# Patient Record
Sex: Male | Born: 1965 | Race: White | Hispanic: No | Marital: Married | Smoking: Never smoker
Health system: Southern US, Community
[De-identification: ages and names within clinical notes are randomized; demographics above are authoritative.]

## PROBLEM LIST (undated history)

## (undated) DIAGNOSIS — N200 Calculus of kidney: Secondary | ICD-10-CM

## (undated) HISTORY — PX: ABDOMINAL SURGERY: SHX537

## (undated) HISTORY — PX: KNEE SURGERY: SHX244

---

## 2005-10-03 ENCOUNTER — Encounter: Admission: RE | Admit: 2005-10-03 | Discharge: 2005-10-03 | Payer: Self-pay | Admitting: Orthopedic Surgery

## 2005-10-07 ENCOUNTER — Ambulatory Visit (HOSPITAL_BASED_OUTPATIENT_CLINIC_OR_DEPARTMENT_OTHER): Admission: RE | Admit: 2005-10-07 | Discharge: 2005-10-08 | Payer: Self-pay | Admitting: Orthopedic Surgery

## 2010-06-02 ENCOUNTER — Emergency Department (HOSPITAL_COMMUNITY)
Admission: EM | Admit: 2010-06-02 | Discharge: 2010-06-02 | Disposition: A | Discharge status: Home / Self Care | Payer: Self-pay | Attending: Emergency Medicine | Admitting: Emergency Medicine

## 2010-06-02 ENCOUNTER — Emergency Department (HOSPITAL_COMMUNITY): Payer: Self-pay

## 2010-06-02 DIAGNOSIS — K612 Anorectal abscess: Secondary | ICD-10-CM | POA: Insufficient documentation

## 2010-06-02 DIAGNOSIS — R1909 Other intra-abdominal and pelvic swelling, mass and lump: Secondary | ICD-10-CM | POA: Insufficient documentation

## 2010-06-02 DIAGNOSIS — K6289 Other specified diseases of anus and rectum: Secondary | ICD-10-CM | POA: Insufficient documentation

## 2010-06-02 DIAGNOSIS — K644 Residual hemorrhoidal skin tags: Secondary | ICD-10-CM | POA: Insufficient documentation

## 2010-06-02 LAB — DIFFERENTIAL
Basophils Absolute: 0.1 K/uL (ref 0.0–0.1)
Basophils Relative: 0 % (ref 0–1)
Eosinophils Absolute: 0.1 10*3/uL (ref 0.0–0.7)
Eosinophils Relative: 1 % (ref 0–5)
Lymphocytes Relative: 22 % (ref 12–46)
Lymphs Abs: 2.5 10*3/uL (ref 0.7–4.0)
Monocytes Absolute: 0.9 10*3/uL (ref 0.1–1.0)
Monocytes Relative: 8 % (ref 3–12)
Neutro Abs: 8 K/uL — ABNORMAL HIGH (ref 1.7–7.7)
Neutrophils Relative %: 69 % (ref 43–77)

## 2010-06-02 LAB — CBC
HCT: 45.6 % (ref 39.0–52.0)
Hemoglobin: 15.8 g/dL (ref 13.0–17.0)
MCH: 31.7 pg (ref 26.0–34.0)
MCHC: 34.6 g/dL (ref 30.0–36.0)
MCV: 91.6 fL (ref 78.0–100.0)
Platelets: 218 10*3/uL (ref 150–400)
RBC: 4.98 MIL/uL (ref 4.22–5.81)
RDW: 12.5 % (ref 11.5–15.5)
WBC: 11.5 10*3/uL — ABNORMAL HIGH (ref 4.0–10.5)

## 2010-06-02 LAB — BASIC METABOLIC PANEL WITH GFR
CO2: 29 meq/L (ref 19–32)
Calcium: 9.5 mg/dL (ref 8.4–10.5)
GFR calc Af Amer: >60 mL/min (ref >60)
Potassium: 4.1 meq/L (ref 3.5–5.1)
Sodium: 141 meq/L (ref 135–145)

## 2010-06-02 LAB — BASIC METABOLIC PANEL
BUN: 19 mg/dL (ref 6–23)
Chloride: 102 mEq/L (ref 96–112)
Creatinine, Ser: 1.15 mg/dL (ref 0.4–1.5)
GFR calc non Af Amer: >60 mL/min (ref >60)
Glucose, Bld: 105 mg/dL — ABNORMAL HIGH (ref 70–99)

## 2010-06-02 MED ORDER — IOHEXOL 300 MG/ML  SOLN: 100.0000 mL | Freq: Once | INTRAMUSCULAR | Status: DC | PRN

## 2010-06-07 ENCOUNTER — Ambulatory Visit: Payer: Self-pay | Admitting: Urgent Care

## 2010-06-14 ENCOUNTER — Encounter: Payer: Self-pay | Admitting: Urgent Care

## 2010-06-14 ENCOUNTER — Encounter: Payer: Self-pay | Admitting: Internal Medicine

## 2010-06-14 ENCOUNTER — Ambulatory Visit (INDEPENDENT_AMBULATORY_CARE_PROVIDER_SITE_OTHER): Payer: Self-pay | Admitting: Urgent Care

## 2010-06-14 DIAGNOSIS — K612 Anorectal abscess: Secondary | ICD-10-CM | POA: Insufficient documentation

## 2010-06-14 DIAGNOSIS — R198 Other specified symptoms and signs involving the digestive system and abdomen: Secondary | ICD-10-CM | POA: Insufficient documentation

## 2010-06-18 ENCOUNTER — Ambulatory Visit (HOSPITAL_COMMUNITY)
Admission: RE | Admit: 2010-06-18 | Discharge: 2010-06-18 | Disposition: A | Discharge status: Home / Self Care | Payer: Self-pay | Source: Ambulatory Visit | Attending: Internal Medicine | Admitting: Internal Medicine

## 2010-06-18 ENCOUNTER — Other Ambulatory Visit: Payer: Self-pay | Admitting: Internal Medicine

## 2010-06-18 ENCOUNTER — Encounter: Payer: Self-pay | Admitting: Internal Medicine

## 2010-06-18 DIAGNOSIS — K5730 Diverticulosis of large intestine without perforation or abscess without bleeding: Secondary | ICD-10-CM | POA: Insufficient documentation

## 2010-06-18 DIAGNOSIS — D128 Benign neoplasm of rectum: Secondary | ICD-10-CM

## 2010-06-18 DIAGNOSIS — D129 Benign neoplasm of anus and anal canal: Secondary | ICD-10-CM | POA: Insufficient documentation

## 2010-06-18 DIAGNOSIS — R933 Abnormal findings on diagnostic imaging of other parts of digestive tract: Secondary | ICD-10-CM | POA: Insufficient documentation

## 2010-06-18 DIAGNOSIS — D126 Benign neoplasm of colon, unspecified: Secondary | ICD-10-CM | POA: Insufficient documentation

## 2010-06-18 DIAGNOSIS — Z800 Family history of malignant neoplasm of digestive organs: Secondary | ICD-10-CM | POA: Insufficient documentation

## 2010-06-22 NOTE — Letter (Signed)
Summary: TCS ORDER  TCS ORDER   Imported By: Ave Filter 06/14/2010 11:28:11  _____________________________________________________________________  External Attachment:    Type:   Image     Comment:   External Document
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
## 2010-06-29 ENCOUNTER — Encounter: Payer: Self-pay | Admitting: Internal Medicine

## 2010-06-30 NOTE — Op Note (Addendum)
NAME:  Grant [name], Grant [name]               ACCOUNT NO.:  [id number]  MEDICAL RECORD NO.:  [id number]           PATIENT TYPE:  O  LOCATION:  [facility]                          FACILITY:  [facility]  PHYSICIAN:  R. Roetta Sessions, M.D. DATE OF BIRTH:  20-Aug-1965  DATE OF PROCEDURE:  06/18/2010 DATE OF DISCHARGE:                              OPERATIVE REPORT   PROCEDURE:  Colonoscopy with snare polypectomy.  INDICATIONS FOR PROCEDURE:  A 45 year old gentleman with recent perirectal abscess I and D'ed in emergency department, has had problems with significant proctalgia.  CT back earlier this month demonstrated questionable small circumferential mass in the upper rectum at most 1 cm in dimensions.  Mr. Viana currently has finished up treatment for his perirectal abscess.  He tells me his proctalgia has resolved.  He is not having any rectal bleeding.  Bowel function is normalized.  Positive family history of colon cancer in multiple second-degree relatives. Given CT findings and family history, we will go ahead and offer him a colonoscopy.  Risks, benefits, alternatives, limitations, imponderables have been discussed, questions answered.  Please see the documentation in the medical record.  PROCEDURE NOTE:  O2 saturation, blood pressure, pulse, respirations were monitored throughout the entire procedure.  CONSCIOUS SEDATION:  Versed 7 mg IV, Demerol 125 mg IV in divided doses, Phenergan 25 mg dilute slow IV push to augment conscious sedation.  INSTRUMENT:  Pentax video chip system.  FINDINGS:  Digital rectal exam revealed no abnormalities.  Endoscopic findings:  Prep was adequate.  Colon:  Colonic mucosa was surveyed from the rectosigmoid junction through the left transverse right colon to the appendiceal orifice, ileocecal valve/cecum.  These structures were well seen and photographed for the record.  From this level, scope was slowly and cautiously withdrawn.  All previously mentioned mucosal  surfaces were again seen.  The patient has single ascending colon diverticulum at 30 cm corresponding midsigmoid.  The patient had a 1-cm angry pedunculated polyp which was hot snared and removed totally in one pass and recovered through with the scope.  Remainder of colonic mucosa appeared normal.  Scope was pulled down to the rectum where a thorough examination of rectal mucosa including retroflexed view of the anal verge demonstrated a 6-mm polyp and at 5 cm of the anal verge, it was hot snared and removed as well.  Remainder of rectal mucosa appeared normal.  The patient tolerated the procedure well.  Cecal withdrawal time 11 minutes.  IMPRESSION:  Rectal polyp status post hot snare polypectomy.  Angry pedunculated polyp midsigmoid status post hot snare polypectomy, solitary ascending colon diverticulum, otherwise normal colon.  RECOMMENDATIONS: 1. No Advil or other NSAIDs, aspirin for next 7 days. 2. Follow up path. 3. Polyp and diverticulosis literature provided to Mr. Nordquist.     Jonathon Bellows, M.D.     RMR/MEDQ  D:  06/18/2010  T:  06/18/2010  Job:  [id number]  cc:   Lear Ng, MD Fax: [phone]  Electronically Signed by Lorrin Goodell M.D. on 06/29/2010 02:41:00 PM Electronically Signed by Lorrin Goodell M.D. on 06/29/2010 03:21:08 PM Electronically Signed by Lorrin Goodell M.D. on  06/29/2010 03:45:26 PM Electronically Signed by Lorrin Goodell M.D. on 06/29/2010 04:22:57 PM Electronically Signed by Lorrin Goodell M.D. on 06/29/2010 04:54:15 PM Electronically Signed by Lorrin Goodell M.D. on 06/29/2010 04:54:15 PM Electronically Signed by Lorrin Goodell M.D. on 06/29/2010 07:36:57 PM

## 2010-07-06 NOTE — Letter (Signed)
Summary: Patient Notice, Colon Biopsy Results  [facility]  [address]   [place], [place] [address]   Phone: [phone]  Fax: [phone]       June 29, 2010   Grant [name] [address] [place], [place]  [address] 02/07/1966    Dear Grant [name],  I am pleased to inform you that the biopsies taken during your recent colonoscopy did not show any evidence of cancer upon pathologic examination.  Additional information/recommendations:  No further action is needed at this time.  Please follow-up with your primary care physician for your other healthcare needs.  Continue with the treatment plan as outlined on the day of your exam.  You should have a repeat colonoscopy examination  in 5 years.  Please call us if you are having persistent problems or have questions about your condition that have not been fully answered at this time.  Sincerely,    R. Roetta Sessions MD, FACP [facility] Ph: [phone]    Fax: [phone]   Appended Document: Patient Notice, Colon Biopsy Results Letter mailed to pt.  Appended Document: Patient Notice, Colon Biopsy Results reminder in epic

## 2012-02-26 IMAGING — CT CT PELVIS W/ CM
2 of 4 series · 16 of 46 positions shown, 18 images · IV contrast (agent unspecified)
Comparison: None.

CLINICAL DATA: Rectal mass.  Rectal pain.

CT PELVIS WITH CONTRAST
TECHNIQUE: Multidetector CT imaging of the pelvis was performed
using the standard protocol following the bolus administration of
intravenous contrast.
Contrast:  100 ml Lmnipaque-UGG

[Series 2: pel_with 5.0 b40f · axial · 0.80mm/px · z∈[-366,-106]mm · 13 of 60 slices shown, 15 images]
[im 4/60  soft-tissue]
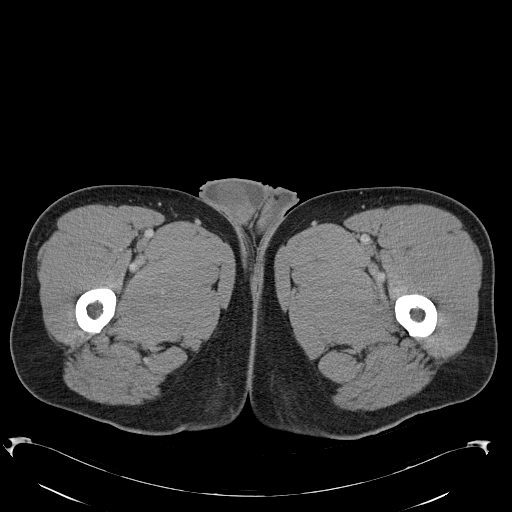
[im 4/60  bone]
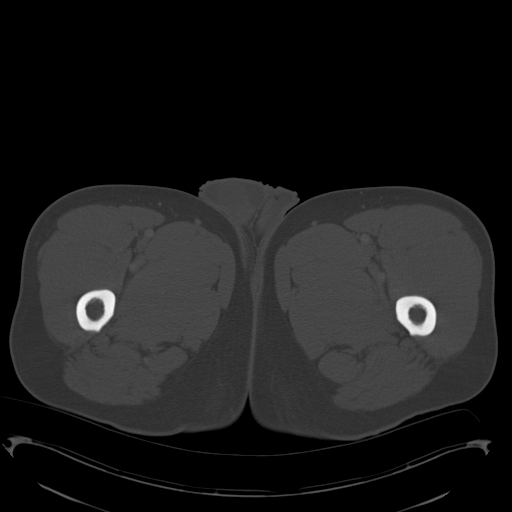
[im 8/60  soft-tissue]
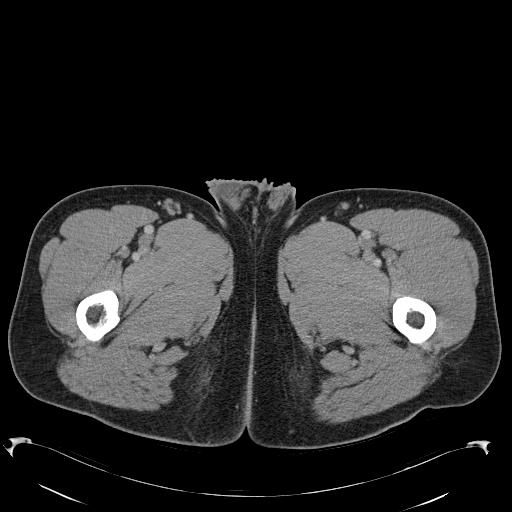
[im 12/60  soft-tissue]
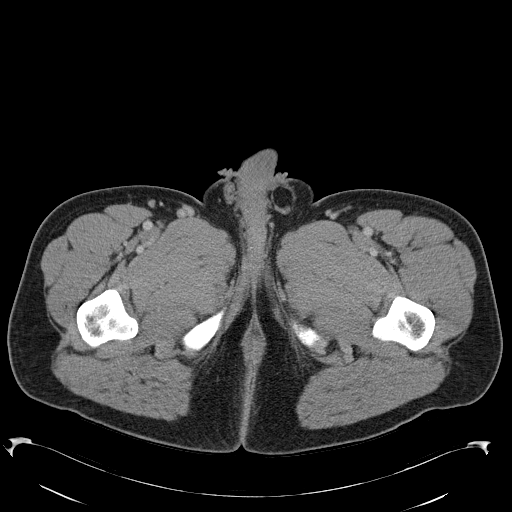
[im 16/60  soft-tissue]
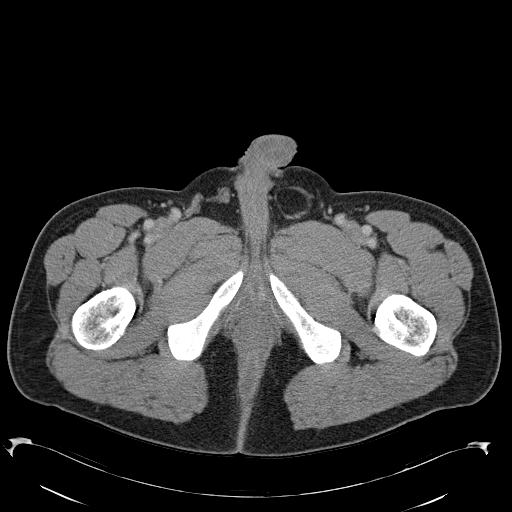
[im 20/60  soft-tissue]
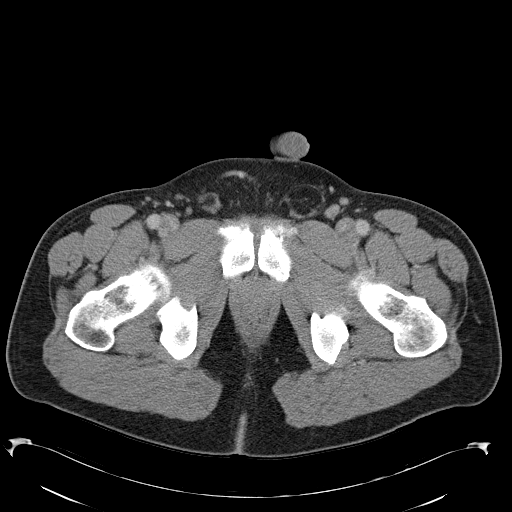
[im 24/60  soft-tissue]
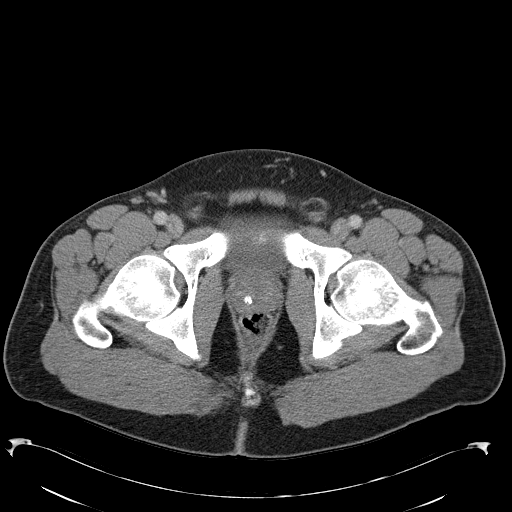
[im 32/60  soft-tissue]
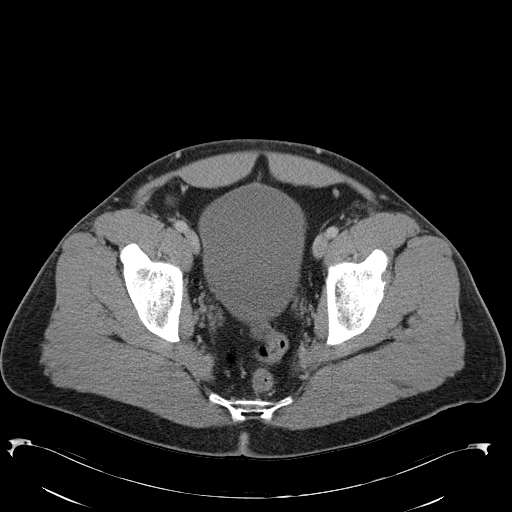
[im 36/60  soft-tissue]
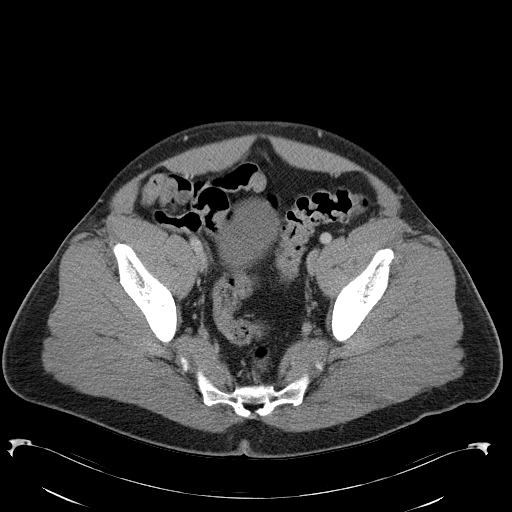
[im 40/60  soft-tissue]
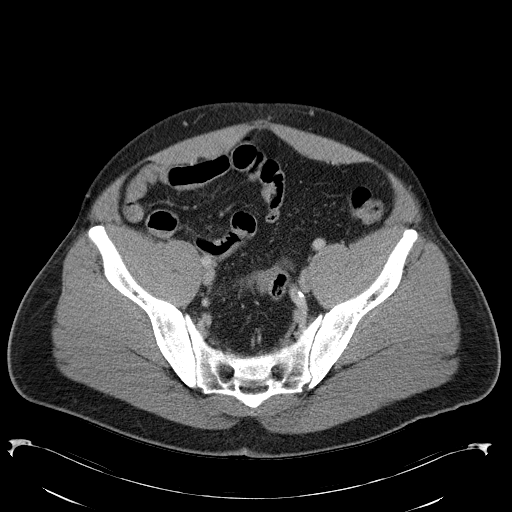
[im 40/60  bone]
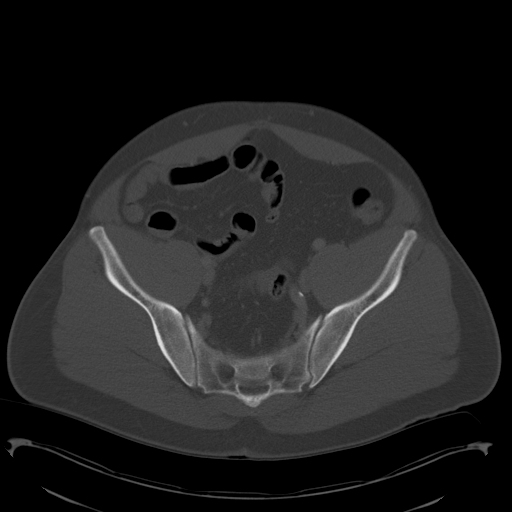
[im 44/60  soft-tissue]
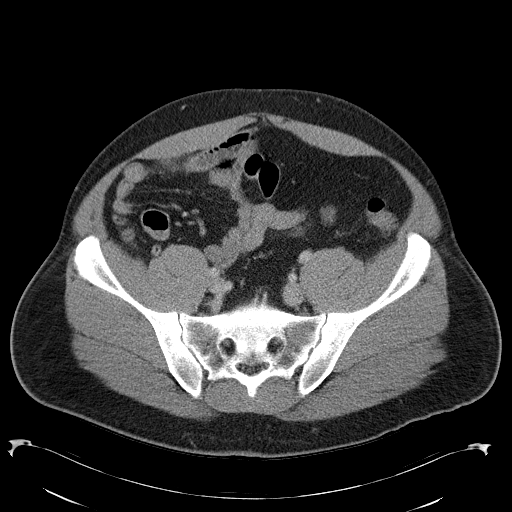
[im 48/60  soft-tissue]
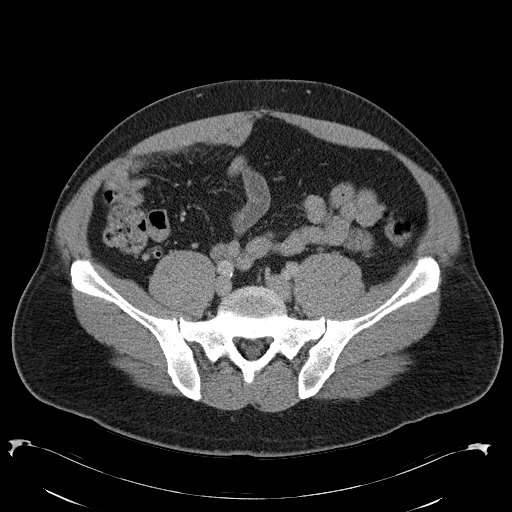
[im 52/60  soft-tissue]
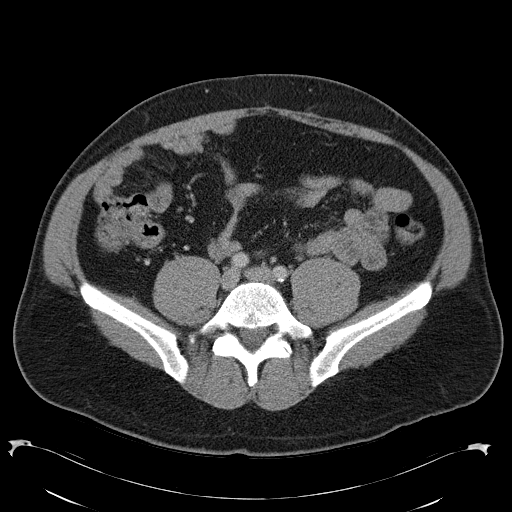
[im 56/60  soft-tissue]
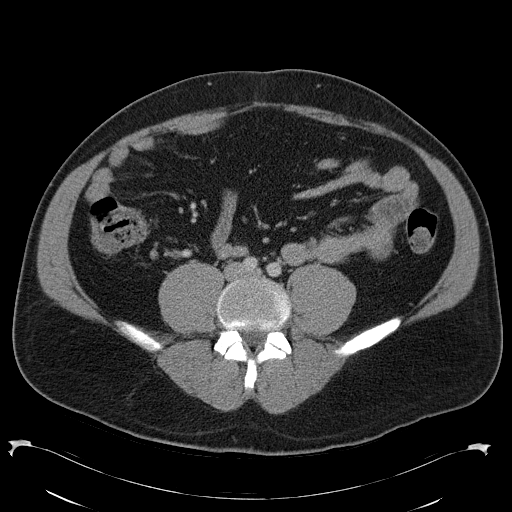

[Series 3: pel_with 3.0 spo cor cor · coronal · 0.59mm/px · 3 of 97 slices shown]
[im 33/97  soft-tissue]
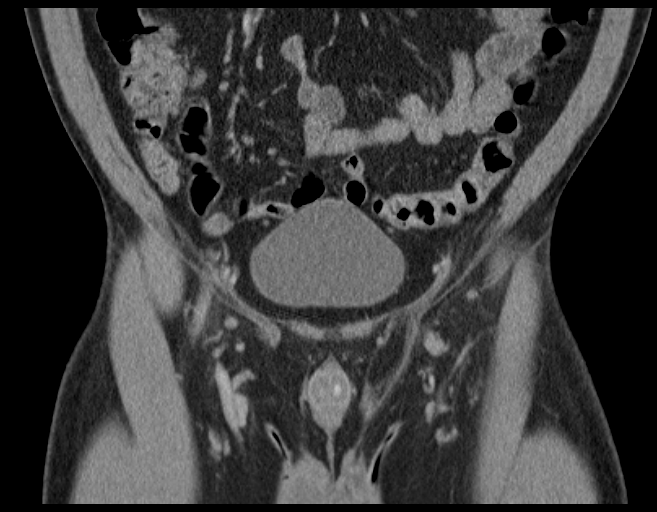
[im 43/97  soft-tissue]
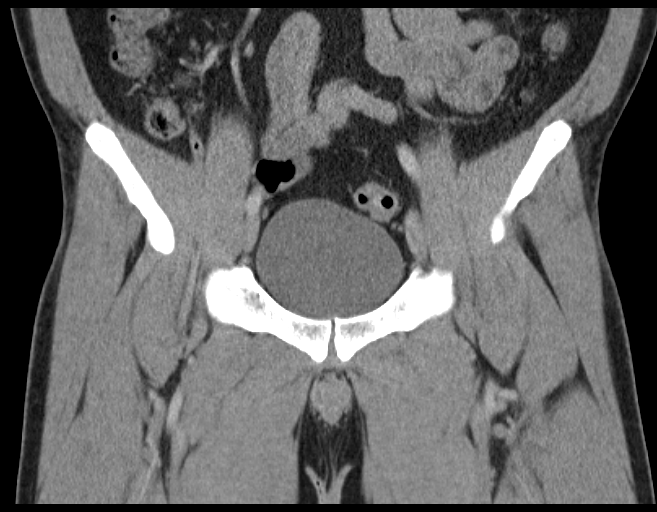
[im 54/97  soft-tissue]
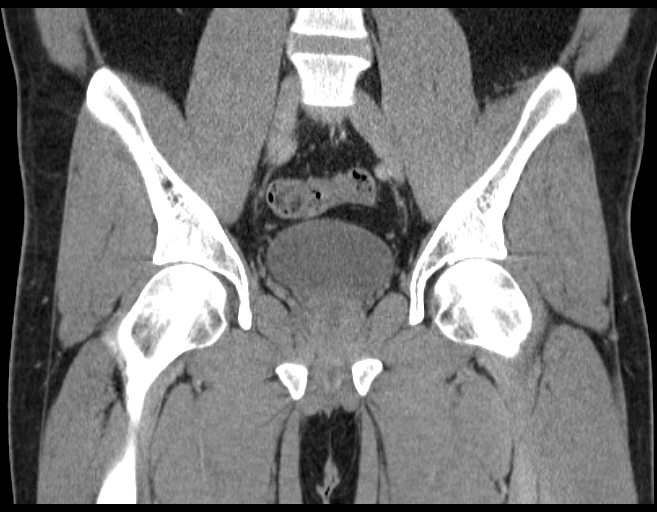

[16 of 46 positions shown; findings below may reference images not displayed]

FINDINGS: There is an area of circumferential narrowing of the
upper rectum approximately 5 cm above the anus which could
represent a colon tumor.  The abnormality is only 1 cm in width.
This is not definitive  but correlates with the history of a
palpable rectal abnormality.  There is no obstruction.

There is no adenopathy.

The terminal ileum and appendix are normal.  The osseous structures
are normal.  Distal ureters and bladder are normal.  Calcifications
are present in the prostate gland, which is not enlarged.  There is
a small amount of fat in a small left inguinal hernia.
IMPRESSION: 1.  Possible small circumferential mass in the upper rectum.  This
only measures 1 cm in width.
2.  No other significant abnormalities.

## 2013-02-02 ENCOUNTER — Observation Stay (HOSPITAL_COMMUNITY): Payer: Self-pay

## 2013-02-02 ENCOUNTER — Inpatient Hospital Stay (HOSPITAL_COMMUNITY)
Admission: EM | Admit: 2013-02-02 | Discharge: 2013-02-06 | DRG: 694 | Disposition: A | Discharge status: Home / Self Care | Payer: Self-pay | Attending: Family Medicine | Admitting: Family Medicine

## 2013-02-02 ENCOUNTER — Encounter (HOSPITAL_COMMUNITY): Payer: Self-pay | Admitting: Emergency Medicine

## 2013-02-02 ENCOUNTER — Emergency Department (HOSPITAL_COMMUNITY): Payer: Self-pay

## 2013-02-02 DIAGNOSIS — Z91041 Radiographic dye allergy status: Secondary | ICD-10-CM

## 2013-02-02 DIAGNOSIS — R198 Other specified symptoms and signs involving the digestive system and abdomen: Secondary | ICD-10-CM

## 2013-02-02 DIAGNOSIS — N132 Hydronephrosis with renal and ureteral calculous obstruction: Secondary | ICD-10-CM

## 2013-02-02 DIAGNOSIS — K612 Anorectal abscess: Secondary | ICD-10-CM

## 2013-02-02 DIAGNOSIS — D72829 Elevated white blood cell count, unspecified: Secondary | ICD-10-CM | POA: Diagnosis present

## 2013-02-02 DIAGNOSIS — N1330 Unspecified hydronephrosis: Secondary | ICD-10-CM

## 2013-02-02 DIAGNOSIS — N201 Calculus of ureter: Principal | ICD-10-CM | POA: Diagnosis present

## 2013-02-02 DIAGNOSIS — N200 Calculus of kidney: Secondary | ICD-10-CM | POA: Diagnosis present

## 2013-02-02 DIAGNOSIS — Z87442 Personal history of urinary calculi: Secondary | ICD-10-CM

## 2013-02-02 DIAGNOSIS — N179 Acute kidney failure, unspecified: Secondary | ICD-10-CM | POA: Diagnosis present

## 2013-02-02 DIAGNOSIS — R109 Unspecified abdominal pain: Secondary | ICD-10-CM

## 2013-02-02 DIAGNOSIS — N23 Unspecified renal colic: Secondary | ICD-10-CM

## 2013-02-02 HISTORY — DX: Calculus of kidney: N20.0

## 2013-02-02 LAB — CBC
HCT: 41.7 % (ref 39.0–52.0)
Hemoglobin: 14.3 g/dL (ref 13.0–17.0)
MCH: 32 pg (ref 26.0–34.0)
MCHC: 34.3 g/dL (ref 30.0–36.0)
MCV: 93.3 fL (ref 78.0–100.0)
Platelets: 213 10*3/uL (ref 150–400)
RBC: 4.47 MIL/uL (ref 4.22–5.81)
RDW: 12.1 % (ref 11.5–15.5)
WBC: 16.8 10*3/uL — ABNORMAL HIGH (ref 4.0–10.5)

## 2013-02-02 LAB — BASIC METABOLIC PANEL
BUN: 18 mg/dL (ref 6–23)
CO2: 28 mEq/L (ref 19–32)
Calcium: 9.1 mg/dL (ref 8.4–10.5)
Chloride: 100 mEq/L (ref 96–112)
Creatinine, Ser: 1.44 mg/dL — ABNORMAL HIGH (ref 0.50–1.35)
GFR calc Af Amer: 66 mL/min — ABNORMAL LOW (ref >90)
GFR calc non Af Amer: 57 mL/min — ABNORMAL LOW (ref >90)
Glucose, Bld: 128 mg/dL — ABNORMAL HIGH (ref 70–99)
Potassium: 3.4 mEq/L — ABNORMAL LOW (ref 3.5–5.1)
Sodium: 136 mEq/L (ref 135–145)

## 2013-02-02 MED ORDER — CIPROFLOXACIN IN D5W 200 MG/100ML IV SOLN
200.0000 mg | INTRAVENOUS | Status: DC
  Administered 2013-02-02: 200 mg via INTRAVENOUS
  Filled 2013-02-02 (×2): qty 100

## 2013-02-02 MED ORDER — SODIUM CHLORIDE 0.9 % IV SOLN
INTRAVENOUS | Status: DC
  Administered 2013-02-02 – 2013-02-04 (×3): via INTRAVENOUS

## 2013-02-02 MED ORDER — OXYCODONE HCL 5 MG PO TABS
5.0000 mg | ORAL_TABLET | ORAL | Status: DC | PRN
  Administered 2013-02-02 – 2013-02-03 (×2): 5 mg via ORAL
  Filled 2013-02-02 (×2): qty 1

## 2013-02-02 MED ORDER — CIPROFLOXACIN IN D5W 200 MG/100ML IV SOLN
INTRAVENOUS | Status: AC
  Filled 2013-02-02: qty 100

## 2013-02-02 MED ORDER — HYDROMORPHONE HCL PF 2 MG/ML IJ SOLN
2.0000 mg | Freq: Once | INTRAMUSCULAR | Status: AC
  Administered 2013-02-02: 2 mg via INTRAVENOUS
  Filled 2013-02-02: qty 1

## 2013-02-02 MED ORDER — KETOROLAC TROMETHAMINE 30 MG/ML IJ SOLN: 30.0000 mg | Freq: Once | INTRAMUSCULAR | Status: DC

## 2013-02-02 MED ORDER — TAMSULOSIN HCL 0.4 MG PO CAPS
0.4000 mg | ORAL_CAPSULE | Freq: Every day | ORAL | Status: DC
  Administered 2013-02-02 – 2013-02-06 (×5): 0.4 mg via ORAL
  Filled 2013-02-02 (×5): qty 1

## 2013-02-02 MED ORDER — ONDANSETRON HCL 4 MG/2ML IJ SOLN
4.0000 mg | Freq: Four times a day (QID) | INTRAMUSCULAR | Status: DC | PRN
  Administered 2013-02-02 – 2013-02-04 (×2): 4 mg via INTRAVENOUS
  Filled 2013-02-02 (×2): qty 2

## 2013-02-02 MED ORDER — HYDROMORPHONE HCL PF 1 MG/ML IJ SOLN
1.0000 mg | INTRAMUSCULAR | Status: DC | PRN
  Administered 2013-02-02: 1 mg via INTRAVENOUS
  Filled 2013-02-02: qty 1

## 2013-02-02 MED ORDER — LORAZEPAM 2 MG/ML IJ SOLN
1.0000 mg | Freq: Once | INTRAMUSCULAR | Status: AC
  Administered 2013-02-02: 1 mg via INTRAVENOUS
  Filled 2013-02-02: qty 1

## 2013-02-02 MED ORDER — FENTANYL CITRATE 0.05 MG/ML IJ SOLN
INTRAMUSCULAR | Status: AC
  Administered 2013-02-02: 100 ug
  Filled 2013-02-02: qty 2

## 2013-02-02 MED ORDER — HEPARIN SODIUM (PORCINE) 5000 UNIT/ML IJ SOLN
5000.0000 [IU] | Freq: Three times a day (TID) | INTRAMUSCULAR | Status: DC
  Administered 2013-02-02 – 2013-02-05 (×8): 5000 [IU] via SUBCUTANEOUS
  Filled 2013-02-02 (×9): qty 1

## 2013-02-02 MED ORDER — ACETAMINOPHEN 650 MG RE SUPP: 650.0000 mg | Freq: Four times a day (QID) | RECTAL | Status: DC | PRN

## 2013-02-02 MED ORDER — HYDROMORPHONE HCL PF 1 MG/ML IJ SOLN
1.0000 mg | INTRAMUSCULAR | Status: DC | PRN
  Administered 2013-02-02 – 2013-02-03 (×7): 1 mg via INTRAVENOUS
  Filled 2013-02-02 (×6): qty 1

## 2013-02-02 MED ORDER — HYDROMORPHONE HCL PF 1 MG/ML IJ SOLN
1.0000 mg | Freq: Once | INTRAMUSCULAR | Status: AC
  Administered 2013-02-02: 1 mg via INTRAVENOUS
  Filled 2013-02-02: qty 1

## 2013-02-02 MED ORDER — PANTOPRAZOLE SODIUM 40 MG IV SOLR
40.0000 mg | INTRAVENOUS | Status: DC
  Administered 2013-02-02 – 2013-02-05 (×4): 40 mg via INTRAVENOUS
  Filled 2013-02-02 (×4): qty 40

## 2013-02-02 MED ORDER — ACETAMINOPHEN 325 MG PO TABS: 650.0000 mg | ORAL_TABLET | Freq: Four times a day (QID) | ORAL | Status: DC | PRN

## 2013-02-02 MED ORDER — SODIUM CHLORIDE 0.9 % IV BOLUS (SEPSIS)
1000.0000 mL | Freq: Once | INTRAVENOUS | Status: AC
  Administered 2013-02-02: 1000 mL via INTRAVENOUS

## 2013-02-02 MED ORDER — ACETAMINOPHEN 500 MG PO TABS
1000.0000 mg | ORAL_TABLET | Freq: Once | ORAL | Status: AC
  Administered 2013-02-02: 1000 mg via ORAL
  Filled 2013-02-02: qty 2

## 2013-02-02 NOTE — ED Notes (Signed)
Dr. Gwendolyn Grant notified of pt's pain and discomfort.  No new orders at this time.

## 2013-02-02 NOTE — ED Notes (Addendum)
Dx with kidney stone x 2 days ago.  Reports increased pain upon waking this morning.  Took Dilaudid 2mg  at 1000 this morning with no relief.  Has not voided since 0900 this morning.  C/o left sided back pain and left sided groin pain.  Denies urgency to void. Pt was given toradol 30mg  IV in route by EMS.

## 2013-02-02 NOTE — H&P (Signed)
[facility]          History and Physical    PCP:   No PCP Per Patient   Chief Complaint:  Left flank and groin pain  HPI: Patient is a 47 year old white man who comes in with severe left-sided groin and flank pain that began approximately 3-4 days ago. 2 days ago he was told he had a left-sided kidney stone. He has no significant past medical history. Because his pain worsened he came into the hospital today where CT scan shows evidence of a left ureteral calculus with hydronephrosis. Urology, Dr. Jerre Simon, has already been consulted. We have been asked to admit him for further evaluation and management.  Allergies:   Allergies  Allergen Reactions  . Ivp Dye [Iodinated Diagnostic Agents]       Past Medical History  Diagnosis Date  . Kidney stone     Past Surgical History  Procedure Laterality Date  . Knee surgery    . Abdominal surgery      Prior to Admission medications   Medication Sig Start Date End Date Taking? Authorizing Provider  HYDROmorphone (DILAUDID) 2 MG tablet Take 2-4 mg by mouth every 3 (three) hours as needed for pain.   Yes Historical Provider, MD  promethazine (PHENERGAN) 12.5 MG tablet Take 12.5 mg by mouth 4 (four) times daily as needed for nausea.   Yes Historical Provider, MD  tamsulosin (FLOMAX) 0.4 MG CAPS capsule Take 0.4 mg by mouth daily.   Yes Historical Provider, MD    Social History:  reports that he has never smoked. He does not have any smokeless tobacco history on file. He reports that he uses illicit drugs (Marijuana). He reports that he does not drink alcohol.  No family history on file.  Review of Systems:  Constitutional: Denies fever, chills, diaphoresis, appetite change and fatigue.  HEENT: Denies photophobia, eye pain, redness, hearing loss, ear pain, congestion, sore throat, rhinorrhea, sneezing, mouth sores, trouble swallowing, neck pain, neck stiffness and tinnitus.   Respiratory: Denies SOB, DOE, cough, chest  tightness,  and wheezing.   Cardiovascular: Denies chest pain, palpitations and leg swelling.  Gastrointestinal: Denies nausea, vomiting, abdominal pain, diarrhea, constipation, blood in stool and abdominal distention.  Genitourinary: Positive for dysuria, urgency, frequency,flank pain and difficulty urinating.  Endocrine: Denies: hot or cold intolerance, sweats, changes in hair or nails, polyuria, polydipsia. Musculoskeletal: Denies myalgias, back pain, joint swelling, arthralgias and gait problem.  Skin: Denies pallor, rash and wound.  Neurological: Denies dizziness, seizures, syncope, weakness, light-headedness, numbness and headaches.  Hematological: Denies adenopathy. Easy bruising, personal or family bleeding history  Psychiatric/Behavioral: Denies suicidal ideation, mood changes, confusion, nervousness, sleep disturbance and agitation   Physical Exam: Blood pressure 104/54, pulse 62, temperature 98 F (36.7 C), temperature source Oral, resp. rate 20, SpO2 96.00%. General: Alert, awake, oriented x3, a little drowsy following pain medication, with severe left-sided flank and groin pain. HEENT: Normocephalic, atraumatic, pupils equal round and reactive to light, extraocular movements intact. Neck: Supple, no JVD, no lymphadenopathy, no bruits, no goiter. Cardiovascular: Regular rate and rhythm, no murmurs, rubs or gallops. Lungs: Clear to auscultation bilaterally. Abdomen: Soft, tender to palpation of the suprapubic and left flank and groin area. Positive bowel sounds. Extremities: No clubbing, cyanosis or edema, positive pedal pulses. Neurologic: Grossly intact and nonfocal.  Labs on Admission:  Results for orders placed during the hospital encounter of 02/02/13 (from the past 48 hour(s))  BASIC METABOLIC PANEL     Status: Abnormal  Collection Time    02/02/13  2:40 PM      Result Value Range   Sodium 136  135 - 145 mEq/L   Potassium 3.4 (*) 3.5 - 5.1 mEq/L   Chloride 100  96 -  112 mEq/L   CO2 28  19 - 32 mEq/L   Glucose, Bld 128 (*) 70 - 99 mg/dL   BUN 18  6 - 23 mg/dL   Creatinine, Ser 1.61 (*) 0.50 - 1.35 mg/dL   Calcium 9.1  8.4 - 09.6 mg/dL   GFR calc non Af Amer 57 (*) >90 mL/min   GFR calc Af Amer 66 (*) >90 mL/min   Comment: (NOTE)     The eGFR has been calculated using the CKD EPI equation.     This calculation has not been validated in all clinical situations.     eGFR's persistently <90 mL/min signify possible Chronic Kidney     Disease.  CBC     Status: Abnormal   Collection Time    02/02/13  2:40 PM      Result Value Range   WBC 16.8 (*) 4.0 - 10.5 K/uL   RBC 4.47  4.22 - 5.81 MIL/uL   Hemoglobin 14.3  13.0 - 17.0 g/dL   HCT 04.5  40.9 - 81.1 %   MCV 93.3  78.0 - 100.0 fL   MCH 32.0  26.0 - 34.0 pg   MCHC 34.3  30.0 - 36.0 g/dL   RDW 91.4  78.2 - 95.6 %   Platelets 213  150 - 400 K/uL    Radiological Exams on Admission: Ct Abdomen Pelvis Wo Contrast  02/02/2013   CLINICAL DATA:  Left flank pain, dysuria.  EXAM: CT ABDOMEN AND PELVIS WITHOUT CONTRAST  TECHNIQUE: Multidetector CT imaging of the abdomen and pelvis was performed following the standard protocol without intravenous contrast.  COMPARISON:  CT scan of June 02, 2010.  FINDINGS: Visualized lung bases appear normal. The liver, spleen and pancreas appear normal. No gallstones are noted. Adrenal glands appear normal. Nonobstructive right nephrolithiasis is noted. Moderate left hydronephrosis and proximal left ureteral dilatation is noted secondary to 8 mm linear calculus in distal left ureter. Urinary bladder appears normal. The appendix is visualized and appears normal. No evidence of bowel obstruction is noted. No abnormal fluid collection is noted. Small fat containing left inguinal hernia is noted. Probable 14 mm cyst is noted in lower pole of right kidney.  IMPRESSION: Moderate left hydroureteronephrosis secondary to a 2 mm linear calculus in distal left ureter. Nonobstructive right  nephrolithiasis is noted.   Electronically Signed   By: Roque Lias M.D.   On: 02/02/2013 15:48    Assessment/Plan Principal Problem:   Hydronephrosis with ureteral calculus Active Problems:   Renal colic on left side   Hydronephrosis  Hydronephrosis with left ureteral calculus/renal colic -Will admit for IV fluids and pain medications. -Urology has already been consulted for definitive management.  DVT prophylaxis -Subcutaneous heparin.  CODE STATUS -Full code.  Time Spent on Admission: 55 minutes  HERNANDEZ ACOSTA,ESTELA [facility] Pager: [phone] 02/02/2013, 4:54 PM

## 2013-02-02 NOTE — ED Provider Notes (Signed)
CSN: [id number]     Arrival date & time 02/02/13  1208 History   First MD Initiated Contact with Patient 02/02/13 1223     Chief Complaint  Patient presents with  . Nephrolithiasis   (Consider location/radiation/quality/duration/timing/severity/associated sxs/prior Treatment) Patient is a 47 y.o. male presenting with abdominal pain. The history is provided by the patient.  Abdominal Pain Pain location:  L flank Pain quality: sharp   Pain radiates to:  Groin Pain severity:  Severe Onset quality:  Sudden Duration:  4 days Timing:  Constant Progression:  Worsening Context: recent illness (diagnosed with L kidney stone 2 days ago)   Relieved by:  Nothing Ineffective treatments: PO dilaudid. Associated symptoms: nausea   Associated symptoms: no anorexia, no cough, no fever, no shortness of breath and no vomiting     Past Medical History  Diagnosis Date  . Kidney stone    Past Surgical History  Procedure Laterality Date  . Knee surgery    . Abdominal surgery     No family history on file. History  Substance Use Topics  . Smoking status: Never Smoker   . Smokeless tobacco: Not on file  . Alcohol Use: No    Review of Systems  Constitutional: Negative for fever.  Respiratory: Negative for cough and shortness of breath.   Gastrointestinal: Positive for nausea. Negative for vomiting, abdominal pain and anorexia.  All other systems reviewed and are negative.    Allergies  Ivp dye  Home Medications   Current Outpatient Rx  Name  Route  Sig  Dispense  Refill  . HYDROmorphone (DILAUDID) 2 MG tablet   Oral   Take 2-4 mg by mouth every 3 (three) hours as needed for pain.         . promethazine (PHENERGAN) 12.5 MG tablet   Oral   Take 12.5 mg by mouth 4 (four) times daily as needed for nausea.         . tamsulosin (FLOMAX) 0.4 MG CAPS capsule   Oral   Take 0.4 mg by mouth daily.          BP 104/54  Pulse 62  Temp(Src) 98 F (36.7 C) (Oral)  Resp 20   SpO2 96% Physical Exam  Nursing note and vitals reviewed. Constitutional: He is oriented to person, place, and time. He appears well-developed and well-nourished. No distress.  HENT:  Head: Normocephalic and atraumatic.  Mouth/Throat: No oropharyngeal exudate.  Eyes: EOM are normal. Pupils are equal, round, and reactive to light.  Neck: Normal range of motion. Neck supple.  Cardiovascular: Normal rate and regular rhythm.  Exam reveals no friction rub.   No murmur heard. Pulmonary/Chest: Effort normal and breath sounds normal. No respiratory distress. He has no wheezes. He has no rales.  Abdominal: He exhibits no distension. There is no tenderness. There is no rebound. Hernia confirmed negative in the right inguinal area and confirmed negative in the left inguinal area.  Genitourinary: Right testis shows no mass, no swelling and no tenderness. Left testis shows tenderness (mild). Left testis shows no mass and no swelling.  Musculoskeletal: Normal range of motion. He exhibits no edema.  Neurological: He is alert and oriented to person, place, and time.  Skin: He is not diaphoretic.    ED Course  Procedures (including critical care time) Labs Review Labs Reviewed  BASIC METABOLIC PANEL - Abnormal; Notable for the following:    Potassium 3.4 (*)    Glucose, Bld 128 (*)    Creatinine, Ser  1.44 (*)    GFR calc non Af Amer 57 (*)    GFR calc Af Amer 66 (*)    All other components within normal limits  CBC - Abnormal; Notable for the following:    WBC 16.8 (*)    All other components within normal limits  URINALYSIS, ROUTINE W REFLEX MICROSCOPIC   Imaging Review Ct Abdomen Pelvis Wo Contrast  02/02/2013   CLINICAL DATA:  Left flank pain, dysuria.  EXAM: CT ABDOMEN AND PELVIS WITHOUT CONTRAST  TECHNIQUE: Multidetector CT imaging of the abdomen and pelvis was performed following the standard protocol without intravenous contrast.  COMPARISON:  CT scan of June 02, 2010.  FINDINGS:  Visualized lung bases appear normal. The liver, spleen and pancreas appear normal. No gallstones are noted. Adrenal glands appear normal. Nonobstructive right nephrolithiasis is noted. Moderate left hydronephrosis and proximal left ureteral dilatation is noted secondary to 8 mm linear calculus in distal left ureter. Urinary bladder appears normal. The appendix is visualized and appears normal. No evidence of bowel obstruction is noted. No abnormal fluid collection is noted. Small fat containing left inguinal hernia is noted. Probable 14 mm cyst is noted in lower pole of right kidney.  IMPRESSION: Moderate left hydroureteronephrosis secondary to a 2 mm linear calculus in distal left ureter. Nonobstructive right nephrolithiasis is noted.   Electronically Signed   By: Roque Lias M.D.   On: 02/02/2013 15:48    EKG Interpretation   None       MDM   1. Kidney stone on left side   2. Abdominal pain    47 year old male presents with left flank pain. He was recent diagnosed with a kidney stone he was told it was 5 mm. He was given by mouth Dilaudid and sent home from the hospital in [place]. Patient reports continued pain today without any relief.  Here vitals are stable. Patient is extremely uncomfortable with left flank pain. He has a normal GU exam with no testicular pain. I will treat patient with Dilaudid. He did get Toradol in route. I got outside records which showed a stone was actually 8 mm, not 5 mm. This is much greater than anticipated. With the larger stone, we'll repeat his scan here to see where it is. Stone in the distal left ureter. Urology consulted, they will see the patient. Patient admitted to the hospitalist.    Dagmar Hait, MD 02/02/13 [phone]

## 2013-02-02 NOTE — Consult Note (Signed)
cosult note [phone]

## 2013-02-02 NOTE — Consult Note (Signed)
Consult note dictated

## 2013-02-03 ENCOUNTER — Encounter (HOSPITAL_COMMUNITY)
Admission: EM | Disposition: A | Discharge status: Home / Self Care | Payer: Self-pay | Source: Home / Self Care | Attending: Internal Medicine

## 2013-02-03 ENCOUNTER — Observation Stay (HOSPITAL_COMMUNITY): Payer: Self-pay

## 2013-02-03 ENCOUNTER — Encounter (HOSPITAL_COMMUNITY): Payer: Self-pay | Admitting: Anesthesiology

## 2013-02-03 ENCOUNTER — Observation Stay (HOSPITAL_COMMUNITY): Payer: Self-pay | Admitting: Anesthesiology

## 2013-02-03 HISTORY — PX: CYSTOSCOPY W/ URETERAL STENT PLACEMENT: SHX1429

## 2013-02-03 LAB — CBC
HCT: 37.5 % — ABNORMAL LOW (ref 39.0–52.0)
Hemoglobin: 12.8 g/dL — ABNORMAL LOW (ref 13.0–17.0)
MCHC: 34.1 g/dL (ref 30.0–36.0)
RBC: 4.02 MIL/uL — ABNORMAL LOW (ref 4.22–5.81)
RDW: 12 % (ref 11.5–15.5)
WBC: 11.1 10*3/uL — ABNORMAL HIGH (ref 4.0–10.5)

## 2013-02-03 LAB — BASIC METABOLIC PANEL
BUN: 17 mg/dL (ref 6–23)
Calcium: 8.8 mg/dL (ref 8.4–10.5)
Chloride: 100 mEq/L (ref 96–112)
Creatinine, Ser: 1.23 mg/dL (ref 0.50–1.35)
GFR calc Af Amer: 80 mL/min — ABNORMAL LOW (ref >90)
GFR calc non Af Amer: 69 mL/min — ABNORMAL LOW (ref >90)
Potassium: 3.4 mEq/L — ABNORMAL LOW (ref 3.5–5.1)

## 2013-02-03 SURGERY — CYSTOSCOPY, WITH RETROGRADE PYELOGRAM AND URETERAL STENT INSERTION
Anesthesia: General | Site: Penis | Laterality: Left | Wound class: Clean Contaminated

## 2013-02-03 MED ORDER — PROPOFOL 10 MG/ML IV EMUL
INTRAVENOUS | Status: AC
  Filled 2013-02-03: qty 20

## 2013-02-03 MED ORDER — SUCCINYLCHOLINE CHLORIDE 20 MG/ML IJ SOLN
INTRAMUSCULAR | Status: DC | PRN
  Administered 2013-02-03: 120 mg via INTRAVENOUS

## 2013-02-03 MED ORDER — ROCURONIUM BROMIDE 100 MG/10ML IV SOLN
INTRAVENOUS | Status: DC | PRN
  Administered 2013-02-03: 5 mg via INTRAVENOUS

## 2013-02-03 MED ORDER — PROPOFOL 10 MG/ML IV BOLUS
INTRAVENOUS | Status: DC | PRN
  Administered 2013-02-03: 200 mg via INTRAVENOUS

## 2013-02-03 MED ORDER — KETOROLAC TROMETHAMINE 30 MG/ML IJ SOLN
15.0000 mg | Freq: Four times a day (QID) | INTRAMUSCULAR | Status: DC | PRN
  Administered 2013-02-03 – 2013-02-04 (×4): 15 mg via INTRAVENOUS
  Filled 2013-02-03 (×3): qty 1

## 2013-02-03 MED ORDER — HYDROMORPHONE HCL PF 1 MG/ML IJ SOLN
1.5000 mg | INTRAMUSCULAR | Status: DC | PRN
  Administered 2013-02-03 – 2013-02-05 (×14): 2 mg via INTRAVENOUS
  Filled 2013-02-03 (×15): qty 2

## 2013-02-03 MED ORDER — FENTANYL CITRATE 0.05 MG/ML IJ SOLN
INTRAMUSCULAR | Status: AC
  Filled 2013-02-03: qty 5

## 2013-02-03 MED ORDER — OXYCODONE HCL 5 MG PO TABS: 5.0000 mg | ORAL_TABLET | ORAL | Status: DC | PRN

## 2013-02-03 MED ORDER — ONDANSETRON HCL 4 MG/2ML IJ SOLN
INTRAMUSCULAR | Status: DC | PRN
  Administered 2013-02-03: 4 mg via INTRAMUSCULAR

## 2013-02-03 MED ORDER — LIDOCAINE HCL 1 % IJ SOLN
INTRAMUSCULAR | Status: DC | PRN
  Administered 2013-02-03: 40 mg via INTRADERMAL

## 2013-02-03 MED ORDER — HYDROMORPHONE HCL PF 1 MG/ML IJ SOLN
1.0000 mg | Freq: Once | INTRAMUSCULAR | Status: DC
  Filled 2013-02-03: qty 1

## 2013-02-03 MED ORDER — ROCURONIUM BROMIDE 50 MG/5ML IV SOLN
INTRAVENOUS | Status: AC
  Filled 2013-02-03: qty 1

## 2013-02-03 MED ORDER — SODIUM CHLORIDE 0.9 % IR SOLN
Status: DC | PRN
  Administered 2013-02-03: 3000 mL

## 2013-02-03 MED ORDER — SUCCINYLCHOLINE CHLORIDE 20 MG/ML IJ SOLN
INTRAMUSCULAR | Status: AC
  Filled 2013-02-03: qty 1

## 2013-02-03 MED ORDER — KETOROLAC TROMETHAMINE 15 MG/ML IJ SOLN
15.0000 mg | Freq: Four times a day (QID) | INTRAMUSCULAR | Status: DC | PRN
  Filled 2013-02-03: qty 1

## 2013-02-03 MED ORDER — ONDANSETRON HCL 4 MG/2ML IJ SOLN
INTRAMUSCULAR | Status: AC
  Filled 2013-02-03: qty 2

## 2013-02-03 MED ORDER — CIPROFLOXACIN IN D5W 200 MG/100ML IV SOLN
200.0000 mg | Freq: Two times a day (BID) | INTRAVENOUS | Status: DC
  Administered 2013-02-03 – 2013-02-06 (×7): 200 mg via INTRAVENOUS
  Filled 2013-02-03 (×10): qty 100

## 2013-02-03 MED ORDER — LIDOCAINE HCL (PF) 1 % IJ SOLN
INTRAMUSCULAR | Status: AC
  Filled 2013-02-03: qty 5

## 2013-02-03 MED ORDER — FENTANYL CITRATE 0.05 MG/ML IJ SOLN
INTRAMUSCULAR | Status: DC | PRN
  Administered 2013-02-03 (×4): 50 ug via INTRAVENOUS

## 2013-02-03 MED ORDER — DEXTROSE-NACL 5-0.45 % IV SOLN
INTRAVENOUS | Status: DC
  Administered 2013-02-03 – 2013-02-06 (×4): via INTRAVENOUS

## 2013-02-03 MED ORDER — LACTATED RINGERS IV SOLN
INTRAVENOUS | Status: DC | PRN
  Administered 2013-02-03: 09:00:00 via INTRAVENOUS

## 2013-02-03 MED ORDER — HYDROMORPHONE HCL PF 1 MG/ML IJ SOLN: 1.0000 mg | INTRAMUSCULAR | Status: DC | PRN

## 2013-02-03 MED ORDER — MIDAZOLAM HCL 5 MG/5ML IJ SOLN
INTRAMUSCULAR | Status: DC | PRN
  Administered 2013-02-03: 2 mg via INTRAVENOUS

## 2013-02-03 MED ORDER — MIDAZOLAM HCL 2 MG/2ML IJ SOLN
INTRAMUSCULAR | Status: AC
  Filled 2013-02-03: qty 2

## 2013-02-03 SURGICAL SUPPLY — 18 items
BAG DRAIN URO TABLE W/ADPT NS (DRAPE) ×2 IMPLANT
BAG DRN 8 ADPR NS SKTRN CSTL (DRAPE) ×1
CATH 5 FR WEDGE TIP (UROLOGICAL SUPPLIES) ×2 IMPLANT
CATH OPEN TIP 5FR (CATHETERS) ×2 IMPLANT
CLOTH BEACON ORANGE TIMEOUT ST (SAFETY) ×2 IMPLANT
GLOVE BIO SURGEON STRL SZ7 (GLOVE) ×2 IMPLANT
GLOVE BIOGEL PI IND STRL 7.5 (GLOVE) IMPLANT
GLOVE BIOGEL PI INDICATOR 7.5 (GLOVE) ×3
GLOVE ECLIPSE 7.0 STRL STRAW (GLOVE) ×1 IMPLANT
GOWN STRL REIN XL XLG (GOWN DISPOSABLE) ×4 IMPLANT
IV NS IRRIG 3000ML ARTHROMATIC (IV SOLUTION) ×4 IMPLANT
KIT ROOM TURNOVER AP CYSTO (KITS) ×2 IMPLANT
MANIFOLD NEPTUNE II (INSTRUMENTS) ×2 IMPLANT
PACK CYSTO (CUSTOM PROCEDURE TRAY) ×2 IMPLANT
PAD ARMBOARD 7.5X6 YLW CONV (MISCELLANEOUS) ×2 IMPLANT
SET IRRIGATING DISP (SET/KITS/TRAYS/PACK) ×2 IMPLANT
STENT PERCUFLEX 4.8FRX24 (STENTS) ×1 IMPLANT
WIRE GUIDE BENTSON .035 15CM (WIRE) ×2 IMPLANT

## 2013-02-03 NOTE — Progress Notes (Signed)
He should get percocet for painif he is discharged

## 2013-02-03 NOTE — Brief Op Note (Signed)
02/02/2013 - 02/03/2013  10:05 AM  PATIENT:  Grant [name]  47 y.o. male  PRE-OPERATIVE DIAGNOSIS:  bilateral calculus  POST-OPERATIVE DIAGNOSIS:  * No post-op diagnosis entered *  PROCEDURE:  Procedure(s): CYSTOSCOPY WITH RETROGRADE PYELOGRAM/URETERAL STENT PLACEMENT (Bilateral)  SURGEON:  Surgeon(s) and Role:    * Ky Barban, MD - Primary  PHYSICIAN ASSISTANT:   ASSISTANTS: none   ANESTHESIA:   general  EBL:  Total I/O In: 500 [I.V.:500] Out: 400 [Urine:400]  BLOOD ADMINISTERED:none  DRAINS: double j stent no string size f5 24 cms   LOCAL MEDICATIONS USED:  NONE  SPECIMEN:  No Specimen  DISPOSITION OF SPECIMEN:  N/A  COUNTS:  YES  TOURNIQUET:  * No tourniquets in log *  DICTATION: .Other Dictation: Dictation Number dictation [phone]  PLAN OF CARE: Discharge to home after PACU  PATIENT DISPOSITION:  PACU - hemodynamically stable.   Delay start of Pharmacological VTE agent (>24hrs) due to surgical blood loss or risk of bleeding: APPLICABLE:[id number]}

## 2013-02-03 NOTE — Anesthesia Preprocedure Evaluation (Addendum)
Anesthesia Evaluation  Patient identified by MRN, date of birth, ID band Patient awake    Reviewed: Allergy & Precautions, H&P , NPO status , Patient's Chart, lab work & pertinent test results, Unable to perform ROS - Chart review only  History of Anesthesia Complications Negative for: history of anesthetic complications  Airway Mallampati: I TM Distance: >3 FB Neck ROM: Full    Dental  (+) Teeth Intact   Pulmonary neg pulmonary ROS,  breath sounds clear to auscultation        Cardiovascular negative cardio ROS  Rhythm:Regular Rate:Normal     Neuro/Psych negative psych ROS   GI/Hepatic negative GI ROS,   Endo/Other    Renal/GU Renal diseaseKidney stone Lt     Musculoskeletal   Abdominal   Peds  Hematology   Anesthesia Other Findings   Reproductive/Obstetrics                         Anesthesia Physical Anesthesia Plan  ASA: I and emergent  Anesthesia Plan: General   Post-op Pain Management:    Induction: Intravenous, Rapid sequence and Cricoid pressure planned  Airway Management Planned: Oral ETT  Additional Equipment:   Intra-op Plan:   Post-operative Plan: Extubation in OR  Informed Consent: I have reviewed the patients History and Physical, chart, labs and discussed the procedure including the risks, benefits and alternatives for the proposed anesthesia with the patient or authorized representative who has indicated his/her understanding and acceptance.     Plan Discussed with: Anesthesiologist  Anesthesia Plan Comments:         Anesthesia Quick Evaluation

## 2013-02-03 NOTE — Anesthesia Procedure Notes (Signed)
Procedure Name: Intubation Date/Time: 02/03/2013 9:30 AM Performed by: Glynn Octave E Pre-anesthesia Checklist: Patient identified, Patient being monitored, Timeout performed, Emergency Drugs available and Suction available Patient Re-evaluated:Patient Re-evaluated prior to inductionOxygen Delivery Method: Circle System Utilized Preoxygenation: Pre-oxygenation with 100% oxygen Intubation Type: IV induction, Rapid sequence and Cricoid Pressure applied Ventilation: Mask ventilation without difficulty Laryngoscope Size: Mac and 3 Grade View: Grade I Tube type: Oral Tube size: 7.0 mm Number of attempts: 1 Airway Equipment and Method: stylet Placement Confirmation: ETT inserted through vocal cords under direct vision,  positive ETCO2 and breath sounds checked- equal and bilateral Secured at: 23 cm Tube secured with: Tape Dental Injury: Teeth and Oropharynx as per pre-operative assessment

## 2013-02-03 NOTE — Discharge Summary (Signed)
Physician Discharge Summary  Grant [name] ZOX:[id number] DOB: Sep 16, 1965 DOA: 02/02/2013  PCP: No PCP Per Patient  Admit date: 02/02/2013 Discharge date: 02/03/2013  Time spent: 45 minutes  Recommendations for Outpatient Follow-up:  -Will be discharged home today. -Will followup with Dr. Jerre Simon as scheduled by his office.   Discharge Diagnoses:  Principal Problem:   Hydronephrosis with ureteral calculus Active Problems:   Renal colic on left side   Hydronephrosis   Discharge Condition: Stable and improved  Filed Weights   02/02/13 1720  Weight: 95.255 kg (210 lb)    History of present illness:  Patient is a 47 year old white man who comes in with severe left-sided groin and flank pain that began approximately 3-4 days ago. 2 days ago he was told he had a left-sided kidney stone. He has no significant past medical history. Because his pain worsened he came into the hospital today where CT scan shows evidence of a left ureteral calculus with hydronephrosis. Urology, Dr. Jerre Simon, has already been consulted. We were asked to admit him for further evaluation and management.   Hospital Course:   Hydronephrosis with left ureteral calculus/renal colic -He is now status post cystoscopy and stent placement by urology with improved symptoms. -Plan is to discharge home today with pain medication to followup with urology next week.  Procedures:  Cystoscopy ureteral stent placement   Consultations:  Urology  Discharge Instructions  Discharge Orders   Future Orders Complete By Expires   Discontinue IV  As directed    Increase activity slowly  As directed        Medication List    STOP taking these medications       HYDROmorphone 2 MG tablet  Commonly known as:  DILAUDID      TAKE these medications       oxyCODONE 5 MG immediate release tablet  Commonly known as:  Oxy IR/ROXICODONE  Take 1 tablet (5 mg total) by mouth every 4 (four) hours as needed.     promethazine 12.5 MG tablet  Commonly known as:  PHENERGAN  Take 12.5 mg by mouth 4 (four) times daily as needed for nausea.     tamsulosin 0.4 MG Caps capsule  Commonly known as:  FLOMAX  Take 0.4 mg by mouth daily.       Allergies  Allergen Reactions  . Ivp Dye [Iodinated Diagnostic Agents]        Follow-up Information   Follow up with Ky Barban, MD. (as scheduled by him)    Specialty:  Urology   Contact information:   [address] [place] [address] [phone]        The results of significant diagnostics from this hospitalization (including imaging, microbiology, ancillary and laboratory) are listed below for reference.    Significant Diagnostic Studies: Ct Abdomen Pelvis Wo Contrast  02/02/2013   CLINICAL DATA:  Left flank pain, dysuria.  EXAM: CT ABDOMEN AND PELVIS WITHOUT CONTRAST  TECHNIQUE: Multidetector CT imaging of the abdomen and pelvis was performed following the standard protocol without intravenous contrast.  COMPARISON:  CT scan of June 02, 2010.  FINDINGS: Visualized lung bases appear normal. The liver, spleen and pancreas appear normal. No gallstones are noted. Adrenal glands appear normal. Nonobstructive right nephrolithiasis is noted. Moderate left hydronephrosis and proximal left ureteral dilatation is noted secondary to 8 mm linear calculus in distal left ureter. Urinary bladder appears normal. The appendix is visualized and appears normal. No evidence of bowel obstruction is noted. No abnormal fluid  collection is noted. Small fat containing left inguinal hernia is noted. Probable 14 mm cyst is noted in lower pole of right kidney.  IMPRESSION: Moderate left hydroureteronephrosis secondary to a 2 mm linear calculus in distal left ureter. Nonobstructive right nephrolithiasis is noted.   Electronically Signed   By: Roque Lias M.D.   On: 02/02/2013 15:48   Dg Abd 1 View  02/03/2013   *RADIOLOGY REPORT*  Clinical Data: Left flank and  back pain.  Follow up distal ureteral stone.  ABDOMEN - 1 VIEW  Comparison: CT of the abdomen and pelvis performed earlier today at 03:40 p.m.  Findings: The 8 mm stone noted in the distal left ureter on the prior study is not well characterized, as it overlies the sacrum. The smaller right renal stones are also not visualized. The visualized bowel gas pattern is grossly unremarkable.  No acute osseous abnormalities are seen.  A bone island is noted at the left ischium, unchanged from 2012.  IMPRESSION: Known 8 mm obstructing stone in the distal left ureter is not well characterized on this study, as it overlies the sacrum.   Original Report Authenticated By: Tonia Ghent, M.D.    Microbiology: Recent Results (from the past 240 hour(s))  SURGICAL PCR SCREEN     Status: None   Collection Time    02/03/13  7:06 AM      Result Value Range Status   MRSA, PCR NEGATIVE  NEGATIVE Final   Staphylococcus aureus NEGATIVE  NEGATIVE Final   Comment:            The Xpert SA Assay (FDA     approved for NASAL specimens     in patients over 1 years of age),     is one component of     a comprehensive surveillance     program.  Test performance has     been validated by [employer] for patients greater     than or equal to 39 year old.     It is not intended     to diagnose infection nor to     guide or monitor treatment.     Labs: Basic Metabolic Panel:  Recent Labs Lab 02/02/13 1440 02/03/13 0633  NA 136 135  K 3.4* 3.4*  CL 100 100  CO2 28 27  GLUCOSE 128* 112*  BUN 18 17  CREATININE 1.44* 1.23  CALCIUM 9.1 8.8   Liver Function Tests: No results found for this basename: AST, ALT, ALKPHOS, BILITOT, PROT, ALBUMIN,  in the last 168 hours No results found for this basename: LIPASE, AMYLASE,  in the last 168 hours No results found for this basename: AMMONIA,  in the last 168 hours CBC:  Recent Labs Lab 02/02/13 1440 02/03/13 0633  WBC 16.8* 11.1*  HGB 14.3 12.8*  HCT 41.7  37.5*  MCV 93.3 93.3  PLT 213 184   Cardiac Enzymes: No results found for this basename: CKTOTAL, CKMB, CKMBINDEX, TROPONINI,  in the last 168 hours BNP: BNP (last 3 results) No results found for this basename: PROBNP,  in the last 8760 hours CBG: No results found for this basename: GLUCAP,  in the last 168 hours     Signed:  Chaya Jan  [facility] Pager: [phone] 02/03/2013, 11:59 AM

## 2013-02-03 NOTE — Progress Notes (Addendum)
Pt did not want Incentive refused. Up talking should be ok.

## 2013-02-03 NOTE — Anesthesia Postprocedure Evaluation (Addendum)
  Anesthesia Post-op Note  Patient: Grant [name]  Procedure(s) Performed: Procedure(s): CYSTOSCOPY WITH RETROGRADE PYELOGRAM/URETERAL STENT PLACEMENT (Bilateral)  Patient Location: PACU  Anesthesia Type:General  Level of Consciousness: awake, alert  and oriented  Airway and Oxygen Therapy: Patient Spontanous Breathing and Patient connected to face mask oxygen  Post-op Pain: none  Post-op Assessment: Post-op Vital signs reviewed, Patient's Cardiovascular Status Stable, Respiratory Function Stable, Patent Airway and No signs of Nausea or vomiting  Post-op Vital Signs: Reviewed and stable 103/80, 65,10 SpO2 100, 36.5  Complications: No apparent anesthesia complications

## 2013-02-03 NOTE — Progress Notes (Signed)
Please discharge patient late today if no pain i will see him in office one week thursday at 4pm.

## 2013-02-03 NOTE — Progress Notes (Signed)
Patient was noncompliant,belligerent and rude when RT came to instruct him on use of IS. He stated" his breathing was fine and that he did not need it" and that "the IS cost ten cents to make and you charge me four hundred dollars, O don't want it." RT left patient room with IS.

## 2013-02-03 NOTE — Transfer of Care (Signed)
Immediate Anesthesia Transfer of Care Note  Patient: Grant [name]  Procedure(s) Performed: Procedure(s): CYSTOSCOPY WITH RETROGRADE PYELOGRAM/URETERAL STENT PLACEMENT (Bilateral)  Patient Location: PACU  Anesthesia Type:General  Level of Consciousness: awake, alert  and oriented  Airway & Oxygen Therapy: Patient Spontanous Breathing and Patient connected to face mask oxygen  Post-op Assessment: Report given to PACU RN  Post vital signs: Reviewed and stable  Complications: No apparent anesthesia complications
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
## 2013-02-03 NOTE — Op Note (Signed)
[name], [name]               ACCOUNT NO.:  [id number]  MEDICAL RECORD NO.:  [id number]  LOCATION:  A317                          FACILITY:  [facility]  PHYSICIAN:  Ky Barban, M.D.DATE OF BIRTH:  08-04-1965  DATE OF PROCEDURE: DATE OF DISCHARGE:                              OPERATIVE REPORT   PREOPERATIVE DIAGNOSIS:  Left distal ureteral calculus.  POSTOPERATIVE DIAGNOSIS:  Left distal ureteral calculus.  PROCEDURE:  Cystoscopy, insertion of double-J stent size 5-French 24 cm, no string attached.  ANESTHESIA:  General.  INDICATIONS:  The patient who has a stone in the distal left ureter continued to have pain, so I decided to put a double-J stent.  After briefing the patient and his family about the procedure, he was taken to the operating room.  DESCRIPTION OF PROCEDURE:  Under general anesthesia, in lithotomy position after usual prep and drape #25 cystoscope introduced into the bladder.  The left ureteral orifice was catheterized with a wedge catheter.  I did not use any iodine based dye, because the patient is allergic to it, so I simply advanced the guidewire up into the renal pelvis.  After the renal pelvis was diagnosed with the guidewire because they started to loop in that area.  Over the guidewire, the open-end catheter was advanced up to the renal pelvis.  The guidewire was removed and I offered injecting 5 mL of normal saline.  I can see there is a significant hydronephrotic drip, so I decided to leave the stent in that area.  A guidewire was reintroduced.  The open-end catheter was removed, and #5-French 24 cm double-J stent was advanced under fluoro control up to the renal pelvis.  The guidewire was removed.  Nice loop was obtained in the renal pelvis and the bladder after removing the guidewire.  Then, I reviewed the films again.  I can see the stone alongside the stent just below the pelvic brim.  It is just coming out of the sacral area. All the  instruments were removed.  The patient left the operating room in satisfactory condition.     Ky Barban, M.D.     MIJ/MEDQ  D:  02/03/2013  T:  02/03/2013  Job:  [id number]

## 2013-02-03 NOTE — Progress Notes (Signed)
Utilization Review completed.  

## 2013-02-04 ENCOUNTER — Ambulatory Visit (HOSPITAL_COMMUNITY): Payer: Self-pay

## 2013-02-04 LAB — BASIC METABOLIC PANEL
BUN: 12 mg/dL (ref 6–23)
CO2: 30 mEq/L (ref 19–32)
Chloride: 98 mEq/L (ref 96–112)
Creatinine, Ser: 1.1 mg/dL (ref 0.50–1.35)
Glucose, Bld: 122 mg/dL — ABNORMAL HIGH (ref 70–99)

## 2013-02-04 LAB — CBC
HCT: 39.5 % (ref 39.0–52.0)
Hemoglobin: 13.4 g/dL (ref 13.0–17.0)
MCH: 31.9 pg (ref 26.0–34.0)
MCV: 94 fL (ref 78.0–100.0)
Platelets: 196 10*3/uL (ref 150–400)
RBC: 4.2 MIL/uL — ABNORMAL LOW (ref 4.22–5.81)
WBC: 10.5 10*3/uL (ref 4.0–10.5)

## 2013-02-04 LAB — URINE CULTURE
Colony Count: NO GROWTH
Culture: NO GROWTH

## 2013-02-04 MED ORDER — CYCLOBENZAPRINE HCL 10 MG PO TABS
5.0000 mg | ORAL_TABLET | Freq: Three times a day (TID) | ORAL | Status: DC | PRN
  Administered 2013-02-04 – 2013-02-05 (×3): 5 mg via ORAL
  Filled 2013-02-04 (×3): qty 1

## 2013-02-04 MED ORDER — HYDROCODONE-ACETAMINOPHEN 7.5-325 MG PO TABS
1.0000 | ORAL_TABLET | ORAL | Status: DC | PRN
  Administered 2013-02-04 – 2013-02-05 (×2): 1 via ORAL
  Filled 2013-02-04 (×2): qty 1

## 2013-02-04 NOTE — Progress Notes (Signed)
Will schedule esl on Wednesday no toral for pain. Discussed with family about lithtripsy may need additional procedure no garuntees that it will work.they understand want me to proceed.Marland Kitchen

## 2013-02-04 NOTE — Progress Notes (Signed)
i reviewed his kub and it is visible along the stent at l4 level on left side..will proceed with esl.

## 2013-02-04 NOTE — Anesthesia Postprocedure Evaluation (Signed)
  Anesthesia Post-op Note  Patient: Grant [name]  Procedure(s) Performed: Procedure(s): CYSTOSCOPY WITH RETROGRADE PYELOGRAM/URETERAL STENT PLACEMENT (Left)  Patient Location: Room 317  Anesthesia Type:General  Level of Consciousness: awake, alert , oriented and patient cooperative  Airway and Oxygen Therapy: Patient Spontanous Breathing  Post-op Pain: moderate  Post-op Assessment: Post-op Vital signs reviewed, Patient's Cardiovascular Status Stable, Respiratory Function Stable, Patent Airway, No signs of Nausea or vomiting and Adequate PO intake  Post-op Vital Signs: Reviewed and stable  Complications: No apparent anesthesia complications

## 2013-02-04 NOTE — Progress Notes (Signed)
[facility] PROGRESS NOTE  Grant [name] WUJ:[id number] DOB: Aug 30, 1965 DOA: 02/02/2013 PCP: No PCP Per Patient  Assessment/Plan: Hydronephrosis with left ureteral calculus/renal colic -He is status post a cystoscopy with retrograde pyelogram and ureteral stent placement bilaterally by Dr. Jerre Simon. -He was supposed to have been discharged home yesterday, however continues to have significant pain and renal colic. -Dr. Jerre Simon has been re consulted for further recommendations. -Will keep patient in the hospital pending urology input. -Continue when necessary pain meds and antiemetics.  Code Status: Full code. Family Communication: Discussed with wife at bedside.  Disposition Plan: Home once stable   Consultants:  Urology, Dr. Jerre Simon   Antibiotics:  None   Subjective: Still with severe left-sided flank and groin pain.  Objective: Filed Vitals:   02/03/13 1425 02/03/13 2109 02/03/13 2156 02/04/13 0543  BP: 113/60 134/69  132/68  Pulse: 60 67 74 64  Temp: 98.5 F (36.9 C) 98.2 F (36.8 C)  97.5 F (36.4 C)  TempSrc:  Oral  Oral  Resp: 18 18 18 18   Height:      Weight:      SpO2: 97% 97% 94% 100%    Intake/Output Summary (Last 24 hours) at 02/04/13 1028 Last data filed at 02/04/13 0900  Gross per 24 hour  Intake 3586.66 ml  Output   1925 ml  Net 1661.66 ml   Filed Weights   02/02/13 1720  Weight: 95.255 kg (210 lb)    Exam:   General:  Alert, awake, oriented x3  Cardiovascular: Regular rate and rhythm  Respiratory: Clear to auscultation bilaterally  Abdomen: Soft, nondistended, positive bowel sounds  Extremities: No clubbing, cyanosis or edema with positive pulses   Neurologic:  Grossly intact and nonfocal  Data Reviewed: Basic Metabolic Panel:  Recent Labs Lab 02/02/13 1440 02/03/13 0633 02/04/13 0519  NA 136 135 137  K 3.4* 3.4* 3.2*  CL 100 100 98  CO2 28 27 30   GLUCOSE 128* 112* 122*  BUN 18 17 12   CREATININE 1.44* 1.23 1.10   CALCIUM 9.1 8.8 9.1   Liver Function Tests: No results found for this basename: AST, ALT, ALKPHOS, BILITOT, PROT, ALBUMIN,  in the last 168 hours No results found for this basename: LIPASE, AMYLASE,  in the last 168 hours No results found for this basename: AMMONIA,  in the last 168 hours CBC:  Recent Labs Lab 02/02/13 1440 02/03/13 0633 02/04/13 0519  WBC 16.8* 11.1* 10.5  HGB 14.3 12.8* 13.4  HCT 41.7 37.5* 39.5  MCV 93.3 93.3 94.0  PLT 213 184 196   Cardiac Enzymes: No results found for this basename: CKTOTAL, CKMB, CKMBINDEX, TROPONINI,  in the last 168 hours BNP (last 3 results) No results found for this basename: PROBNP,  in the last 8760 hours CBG: No results found for this basename: GLUCAP,  in the last 168 hours  Recent Results (from the past 240 hour(s))  SURGICAL PCR SCREEN     Status: None   Collection Time    02/03/13  7:06 AM      Result Value Range Status   MRSA, PCR NEGATIVE  NEGATIVE Final   Staphylococcus aureus NEGATIVE  NEGATIVE Final   Comment:            The Xpert SA Assay (FDA     approved for NASAL specimens     in patients over 33 years of age),     is one component of     a comprehensive surveillance  program.  Test performance has     been validated by [facility] for patients greater     than or equal to 41 year old.     It is not intended     to diagnose infection nor to     guide or monitor treatment.     Studies: Ct Abdomen Pelvis Wo Contrast  02/02/2013   CLINICAL DATA:  Left flank pain, dysuria.  EXAM: CT ABDOMEN AND PELVIS WITHOUT CONTRAST  TECHNIQUE: Multidetector CT imaging of the abdomen and pelvis was performed following the standard protocol without intravenous contrast.  COMPARISON:  CT scan of June 02, 2010.  FINDINGS: Visualized lung bases appear normal. The liver, spleen and pancreas appear normal. No gallstones are noted. Adrenal glands appear normal. Nonobstructive right nephrolithiasis is noted. Moderate left  hydronephrosis and proximal left ureteral dilatation is noted secondary to 8 mm linear calculus in distal left ureter. Urinary bladder appears normal. The appendix is visualized and appears normal. No evidence of bowel obstruction is noted. No abnormal fluid collection is noted. Small fat containing left inguinal hernia is noted. Probable 14 mm cyst is noted in lower pole of right kidney.  IMPRESSION: Moderate left hydroureteronephrosis secondary to a 2 mm linear calculus in distal left ureter. Nonobstructive right nephrolithiasis is noted.   Electronically Signed   By: Roque Lias M.D.   On: 02/02/2013 15:48   Dg Abd 1 View  02/03/2013   *RADIOLOGY REPORT*  Clinical Data: Left flank and back pain.  Follow up distal ureteral stone.  ABDOMEN - 1 VIEW  Comparison: CT of the abdomen and pelvis performed earlier today at 03:40 p.m.  Findings: The 8 mm stone noted in the distal left ureter on the prior study is not well characterized, as it overlies the sacrum. The smaller right renal stones are also not visualized. The visualized bowel gas pattern is grossly unremarkable.  No acute osseous abnormalities are seen.  A bone island is noted at the left ischium, unchanged from 2012.  IMPRESSION: Known 8 mm obstructing stone in the distal left ureter is not well characterized on this study, as it overlies the sacrum.   Original Report Authenticated By: Tonia Ghent, M.D.   Dg C-arm 1-60 Min  02/03/2013   CLINICAL DATA:  Left ureteral stent placement.  EXAM: DG C-ARM 1-60 MIN  TECHNIQUE: Intraoperative fluoroscopic views of the abdomen were obtained.  COMPARISON:  Abdominal radiograph 02/02/2013.  FLUOROSCOPY TIME:  1 minute and 13 seconds.  FINDINGS: Two intraoperative fluoroscopic spot views of the left side of the abdomen appeared demonstrate placement of a left-sided double-J ureteral stent with approximately of reformed in the left renal pelvis and distally performed in the urinary bladder.  IMPRESSION:  Intraoperative documentation of left double-J ureteral stent placement, as above.   Electronically Signed   By: Trudie Reed M.D.   On: 02/03/2013 15:12    Scheduled Meds: . ciprofloxacin  200 mg Intravenous Q12H  . heparin  5,000 Units Subcutaneous Q8H  .  HYDROmorphone (DILAUDID) injection  1 mg Intravenous Once  . pantoprazole (PROTONIX) IV  40 mg Intravenous Q24H  . tamsulosin  0.4 mg Oral Daily   Continuous Infusions: . dextrose 5 % and 0.45% NaCl 100 mL/hr at 02/04/13 0459    Principal Problem:   Hydronephrosis with ureteral calculus Active Problems:   Renal colic on left side   Hydronephrosis    Time spent: 35 minutes    HERNANDEZ ACOSTA,Seaver Machia  [facility] Pager [phone]  If 7PM-7AM, please contact night-coverage at [email or web address], password [facility] 02/04/2013, 10:28 AM  LOS: 2 days

## 2013-02-05 ENCOUNTER — Encounter (HOSPITAL_COMMUNITY): Payer: Self-pay | Admitting: Urology

## 2013-02-05 DIAGNOSIS — N201 Calculus of ureter: Principal | ICD-10-CM

## 2013-02-05 DIAGNOSIS — R109 Unspecified abdominal pain: Secondary | ICD-10-CM

## 2013-02-05 LAB — BASIC METABOLIC PANEL
BUN: 12 mg/dL (ref 6–23)
CO2: 30 mEq/L (ref 19–32)
Calcium: 9 mg/dL (ref 8.4–10.5)
Chloride: 99 mEq/L (ref 96–112)
Creatinine, Ser: 1.15 mg/dL (ref 0.50–1.35)
GFR calc Af Amer: 87 mL/min — ABNORMAL LOW (ref >90)
Sodium: 137 mEq/L (ref 135–145)

## 2013-02-05 LAB — CBC
HCT: 38.7 % — ABNORMAL LOW (ref 39.0–52.0)
MCH: 31.8 pg (ref 26.0–34.0)
MCHC: 34.4 g/dL (ref 30.0–36.0)
MCV: 92.6 fL (ref 78.0–100.0)
RDW: 11.9 % (ref 11.5–15.5)
WBC: 8.2 10*3/uL (ref 4.0–10.5)

## 2013-02-05 MED ORDER — POTASSIUM CHLORIDE CRYS ER 20 MEQ PO TBCR
40.0000 meq | EXTENDED_RELEASE_TABLET | ORAL | Status: AC
  Administered 2013-02-05 (×2): 40 meq via ORAL
  Filled 2013-02-05 (×2): qty 2

## 2013-02-05 MED ORDER — HYDROMORPHONE HCL 4 MG PO TABS
4.0000 mg | ORAL_TABLET | ORAL | Status: DC | PRN
  Administered 2013-02-05 (×2): 4 mg via ORAL
  Administered 2013-02-05 – 2013-02-06 (×5): 8 mg via ORAL
  Filled 2013-02-05 (×2): qty 2
  Filled 2013-02-05: qty 1
  Filled 2013-02-05 (×3): qty 2

## 2013-02-05 NOTE — Progress Notes (Signed)
Clinically is doing well will switch iv dilaudid to oral he is for esl tomorrow.

## 2013-02-05 NOTE — Progress Notes (Signed)
[facility] PROGRESS NOTE  Grant [name] ZOX:[id number] DOB: 14-Jul-1965 DOA: 02/02/2013 PCP: No PCP Per Patient  Assessment/Plan: Hydronephrosis with left ureteral calculus/renal colic -He is status post a cystoscopy with retrograde pyelogram and ureteral stent placement bilaterally by Dr. Jerre Simon. -Will undergo lithotripsy tomorrow. -Continue when necessary pain meds and antiemetics.  Code Status: Full code. Family Communication: Discussed with wife in hallway. She is very appreciative of care rendered by floor staff. Disposition Plan: Home once stable   Consultants:  Urology, Dr. Jerre Simon   Antibiotics:  None   Subjective: Still with severe left-sided flank and groin pain. Does not want meals from the hospital.  Objective: Filed Vitals:   02/03/13 2156 02/04/13 0543 02/04/13 1255 02/05/13 0458  BP:  132/68 124/76 123/71  Pulse: 74 64 60 60  Temp:  97.5 F (36.4 C) 98 F (36.7 C) 98.1 F (36.7 C)  TempSrc:  Oral Oral Oral  Resp: 18 18 18 19   Height:      Weight:      SpO2: 94% 100% 98% 97%    Intake/Output Summary (Last 24 hours) at 02/05/13 1029 Last data filed at 02/05/13 0502  Gross per 24 hour  Intake    960 ml  Output   1400 ml  Net   -440 ml   Filed Weights   02/02/13 1720  Weight: 95.255 kg (210 lb)    Exam:   General:  Alert, awake, oriented x3  Cardiovascular: Regular rate and rhythm  Respiratory: Clear to auscultation bilaterally  Abdomen: Soft, nondistended, positive bowel sounds  Extremities: No clubbing, cyanosis or edema with positive pulses   Neurologic:  Grossly intact and nonfocal  Data Reviewed: Basic Metabolic Panel:  Recent Labs Lab 02/02/13 1440 02/03/13 0633 02/04/13 0519 02/05/13 0528  NA 136 135 137 137  K 3.4* 3.4* 3.2* 3.0*  CL 100 100 98 99  CO2 28 27 30 30   GLUCOSE 128* 112* 122* 144*  BUN 18 17 12 12   CREATININE 1.44* 1.23 1.10 1.15  CALCIUM 9.1 8.8 9.1 9.0   Liver Function Tests: No results  found for this basename: AST, ALT, ALKPHOS, BILITOT, PROT, ALBUMIN,  in the last 168 hours No results found for this basename: LIPASE, AMYLASE,  in the last 168 hours No results found for this basename: AMMONIA,  in the last 168 hours CBC:  Recent Labs Lab 02/02/13 1440 02/03/13 0633 02/04/13 0519 02/05/13 0528  WBC 16.8* 11.1* 10.5 8.2  HGB 14.3 12.8* 13.4 13.3  HCT 41.7 37.5* 39.5 38.7*  MCV 93.3 93.3 94.0 92.6  PLT 213 184 196 213   Cardiac Enzymes: No results found for this basename: CKTOTAL, CKMB, CKMBINDEX, TROPONINI,  in the last 168 hours BNP (last 3 results) No results found for this basename: PROBNP,  in the last 8760 hours CBG: No results found for this basename: GLUCAP,  in the last 168 hours  Recent Results (from the past 240 hour(s))  URINE CULTURE     Status: None   Collection Time    02/02/13  9:36 PM      Result Value Range Status   Specimen Description URINE, CLEAN CATCH   Final   Special Requests NONE   Final   Culture  Setup Time     Final   Value: 02/03/2013 19:48     Performed at [facility] Count     Final   Value: NO GROWTH     Performed at [facility]  Culture     Final   Value: NO GROWTH     Performed at [facility]   Report Status 02/04/2013 FINAL   Final  SURGICAL PCR SCREEN     Status: None   Collection Time    02/03/13  7:06 AM      Result Value Range Status   MRSA, PCR NEGATIVE  NEGATIVE Final   Staphylococcus aureus NEGATIVE  NEGATIVE Final   Comment:            The Xpert SA Assay (FDA     approved for NASAL specimens     in patients over 80 years of age),     is one component of     a comprehensive surveillance     program.  Test performance has     been validated by [facility] [employer] for patients greater     than or equal to 29 year old.     It is not intended     to diagnose infection nor to     guide or monitor treatment.     Studies: Dg Abd 1 View  02/04/2013   CLINICAL DATA:  Left  kidney stone.  EXAM: ABDOMEN - 1 VIEW  COMPARISON:  CT 02/02/2013  FINDINGS: Left ureteral stent is in place. Calcification projects adjacent to the mid portion of the left ureteral stent along the inferior L4 vertebral body, likely the previously seen left ureteral stone. No additional suspicious calcifications. Nonobstructive bowel gas pattern.  IMPRESSION: Mid left ureteral stone. Left ureteral stent in place.   Electronically Signed   By: Charlett Nose M.D.   On: 02/04/2013 16:11   Dg C-arm 1-60 Min  02/03/2013   CLINICAL DATA:  Left ureteral stent placement.  EXAM: DG C-ARM 1-60 MIN  TECHNIQUE: Intraoperative fluoroscopic views of the abdomen were obtained.  COMPARISON:  Abdominal radiograph 02/02/2013.  FLUOROSCOPY TIME:  1 minute and 13 seconds.  FINDINGS: Two intraoperative fluoroscopic spot views of the left side of the abdomen appeared demonstrate placement of a left-sided double-J ureteral stent with approximately of reformed in the left renal pelvis and distally performed in the urinary bladder.  IMPRESSION: Intraoperative documentation of left double-J ureteral stent placement, as above.   Electronically Signed   By: Trudie Reed M.D.   On: 02/03/2013 15:12    Scheduled Meds: . ciprofloxacin  200 mg Intravenous Q12H  . heparin  5,000 Units Subcutaneous Q8H  .  HYDROmorphone (DILAUDID) injection  1 mg Intravenous Once  . pantoprazole (PROTONIX) IV  40 mg Intravenous Q24H  . potassium chloride  40 mEq Oral Q4H  . tamsulosin  0.4 mg Oral Daily   Continuous Infusions: . dextrose 5 % and 0.45% NaCl 100 mL/hr at 02/04/13 0459    Principal Problem:   Hydronephrosis with ureteral calculus Active Problems:   Renal colic on left side   Hydronephrosis    Time spent: 25 minutes    HERNANDEZ ACOSTA,ESTELA  [facility] Pager [phone]  If 7PM-7AM, please contact night-coverage at [email or web address], password [facility] 02/05/2013, 10:29 AM  LOS: 3 days

## 2013-02-06 ENCOUNTER — Encounter (HOSPITAL_COMMUNITY)
Admission: EM | Disposition: A | Discharge status: Home / Self Care | Payer: Self-pay | Source: Home / Self Care | Attending: Internal Medicine

## 2013-02-06 DIAGNOSIS — N23 Unspecified renal colic: Secondary | ICD-10-CM

## 2013-02-06 DIAGNOSIS — N200 Calculus of kidney: Secondary | ICD-10-CM

## 2013-02-06 HISTORY — PX: EXTRACORPOREAL SHOCK WAVE LITHOTRIPSY: SHX1557

## 2013-02-06 SURGERY — LITHOTRIPSY, ESWL
Anesthesia: Moderate Sedation | Laterality: Left

## 2013-02-06 MED ORDER — HYDROMORPHONE HCL 4 MG PO TABS
4.0000 mg | ORAL_TABLET | ORAL | Status: AC | PRN
Start: 2013-02-06 — End: ?

## 2013-02-06 MED ORDER — DIAZEPAM 5 MG PO TABS
ORAL_TABLET | ORAL | Status: AC
  Filled 2013-02-06: qty 1

## 2013-02-06 MED ORDER — DIPHENHYDRAMINE HCL 25 MG PO CAPS: 25.0000 mg | ORAL_CAPSULE | Freq: Once | ORAL | Status: DC

## 2013-02-06 MED ORDER — DIPHENHYDRAMINE HCL 25 MG PO CAPS
ORAL_CAPSULE | ORAL | Status: AC
  Filled 2013-02-06: qty 1

## 2013-02-06 MED ORDER — DIPHENHYDRAMINE HCL 25 MG PO TABS
25.0000 mg | ORAL_TABLET | Freq: Once | ORAL | Status: AC
  Administered 2013-02-06: 25 mg via ORAL
  Filled 2013-02-06: qty 1

## 2013-02-06 MED ORDER — DIAZEPAM 5 MG PO TABS
10.0000 mg | ORAL_TABLET | Freq: Once | ORAL | Status: AC
  Administered 2013-02-06: 10 mg via ORAL

## 2013-02-06 MED ORDER — DIPHENHYDRAMINE HCL 25 MG PO TABS
25.0000 mg | ORAL_TABLET | Freq: Once | ORAL | Status: DC
  Filled 2013-02-06: qty 1

## 2013-02-06 SURGICAL SUPPLY — 3 items
CLOTH BEACON ORANGE TIMEOUT ST (SAFETY) IMPLANT
GOWN STRL REIN XL XLG (GOWN DISPOSABLE) IMPLANT
TOWEL OR 17X26 4PK STRL BLUE (TOWEL DISPOSABLE) IMPLANT

## 2013-02-06 NOTE — OR Nursing (Signed)
Spoke with Grant [name] in lab called to see if Ptt result is ready. Blood result not ready at this time. Patrcia at Dr. Jerre Simon aware result is pending. When result is ready will page Dr. Jerre Simon. Elease Hashimoto to let [facility] know situation.

## 2013-02-06 NOTE — Progress Notes (Signed)
UR Chart Review Completed  

## 2013-02-06 NOTE — Progress Notes (Signed)
Patient discharged home.  IV removed - WNL.  Instructed to call Dr. Lawerance Cruel office in AM to schedule follow up next week.  Instructed on new med prescription and how to take.  Instructed on no heavy lifting and to drink plenty of fluids.  Patient has no questions at this time.  Left floor in stable condition via WC with NT.

## 2013-02-06 NOTE — OR Nursing (Signed)
Notified Dr. Jerre Simon office in regards to pt. receiving heparin 5000units SQ last night at 2226. Elease Hashimoto notified [facility], Hospitalist ordered a stat PTT . If pTT is normal [facility] is in agreement to proceed with ESL.Marland Kitchen

## 2013-02-06 NOTE — Discharge Summary (Signed)
Physician Discharge Summary  Grant [name] ZOX:[id number] DOB: December 11, 1965 DOA: 02/02/2013  PCP: No PCP Per Patient  Admit date: 02/02/2013 Discharge date: 02/06/2013  Recommendations for Outpatient Follow-up:  1. Followup left ureteral calculus with hydronephrosis, stent placement, status post ESL   Follow-up Information   Follow up with Grant Barban, MD. (as scheduled by him)    Specialty:  Urology   Contact information:   [address] [place] [address] [phone]      Discharge Diagnoses:  1. Left ureteral calculus with hydronephrosis 2. Acute renal failure  Discharge Condition: Improved Disposition: Home  Diet recommendation: Regular  Filed Weights   02/02/13 1720  Weight: 95.255 kg (210 lb)    History of present illness:  47 year old man presented with left groin and flank pain. CT scan showed left ureteral calculus with hydronephrosis.  Hospital Course:  Grant [name] was admitted for pain control and urology consultation. He underwent double-J stent placement and discharge home was recommended, however the patient continued to have pain and therefore ESL was performed 10/15. Case discussed with urology--patient stable for discharge. Patient ambulating in room without difficulty and wants to go home.    Discharge Instructions  Discharge Orders   Future Orders Complete By Expires   Diet general  As directed    Discharge instructions  As directed    Comments:     Keep followup appointment with Dr. Jerre [name]. Call physician or seek immediate medical attention for increased pain, fever, nausea, vomiting or worsening of condition   Discontinue IV  As directed    Increase activity slowly  As directed    Increase activity slowly  As directed        Medication List    STOP taking these medications       promethazine 12.5 MG tablet  Commonly known as:  PHENERGAN      TAKE these medications       HYDROmorphone 4 MG tablet  Commonly known as:   DILAUDID  Take 1 tablet (4 mg total) by mouth every 3 (three) hours as needed (severe pain).     tamsulosin 0.4 MG Caps capsule  Commonly known as:  FLOMAX  Take 0.4 mg by mouth daily.       Note that Dilaudid Rx was provided by Dr. Jerre [name]  Allergies  Allergen Reactions  . Ivp Dye [Iodinated Diagnostic Agents]     The results of significant diagnostics from this hospitalization (including imaging, microbiology, ancillary and laboratory) are listed below for reference.    Significant Diagnostic Studies: Ct Abdomen Pelvis Wo Contrast  02/02/2013   CLINICAL DATA:  Left flank pain, dysuria.  EXAM: CT ABDOMEN AND PELVIS WITHOUT CONTRAST  TECHNIQUE: Multidetector CT imaging of the abdomen and pelvis was performed following the standard protocol without intravenous contrast.  COMPARISON:  CT scan of June 02, 2010.  FINDINGS: Visualized lung bases appear normal. The liver, spleen and pancreas appear normal. No gallstones are noted. Adrenal glands appear normal. Nonobstructive right nephrolithiasis is noted. Moderate left hydronephrosis and proximal left ureteral dilatation is noted secondary to 8 mm linear calculus in distal left ureter. Urinary bladder appears normal. The appendix is visualized and appears normal. No evidence of bowel obstruction is noted. No abnormal fluid collection is noted. Small fat containing left inguinal hernia is noted. Probable 14 mm cyst is noted in lower pole of right kidney.  IMPRESSION: Moderate left hydroureteronephrosis secondary to a 2 mm linear calculus in distal left ureter. Nonobstructive right nephrolithiasis is  noted.   Electronically Signed   By: Roque Lias M.D.   On: 02/02/2013 15:48   Dg Abd 1 View  02/04/2013   CLINICAL DATA:  Left kidney stone.  EXAM: ABDOMEN - 1 VIEW  COMPARISON:  CT 02/02/2013  FINDINGS: Left ureteral stent is in place. Calcification projects adjacent to the mid portion of the left ureteral stent along the inferior L4 vertebral  body, likely the previously seen left ureteral stone. No additional suspicious calcifications. Nonobstructive bowel gas pattern.  IMPRESSION: Mid left ureteral stone. Left ureteral stent in place.   Electronically Signed   By: Charlett Nose M.D.   On: 02/04/2013 16:11   Dg Abd 1 View  02/03/2013   *RADIOLOGY REPORT*  Clinical Data: Left flank and back pain.  Follow up distal ureteral stone.  ABDOMEN - 1 VIEW  Comparison: CT of the abdomen and pelvis performed earlier today at 03:40 p.m.  Findings: The 8 mm stone noted in the distal left ureter on the prior study is not well characterized, as it overlies the sacrum. The smaller right renal stones are also not visualized. The visualized bowel gas pattern is grossly unremarkable.  No acute osseous abnormalities are seen.  A bone island is noted at the left ischium, unchanged from 2012.  IMPRESSION: Known 8 mm obstructing stone in the distal left ureter is not well characterized on this study, as it overlies the sacrum.   Original Report Authenticated By: Tonia Ghent, M.D.   Dg C-arm 1-60 Min  02/03/2013   CLINICAL DATA:  Left ureteral stent placement.  EXAM: DG C-ARM 1-60 MIN  TECHNIQUE: Intraoperative fluoroscopic views of the abdomen were obtained.  COMPARISON:  Abdominal radiograph 02/02/2013.  FLUOROSCOPY TIME:  1 minute and 13 seconds.  FINDINGS: Two intraoperative fluoroscopic spot views of the left side of the abdomen appeared demonstrate placement of a left-sided double-J ureteral stent with approximately of reformed in the left renal pelvis and distally performed in the urinary bladder.  IMPRESSION: Intraoperative documentation of left double-J ureteral stent placement, as above.   Electronically Signed   By: Trudie Reed M.D.   On: 02/03/2013 15:12    Microbiology: Recent Results (from the past 240 hour(s))  URINE CULTURE     Status: None   Collection Time    02/02/13  9:36 PM      Result Value Range Status   Specimen Description URINE,  CLEAN CATCH   Final   Special Requests NONE   Final   Culture  Setup Time     Final   Value: 02/03/2013 19:48     Performed at [facility] Count     Final   Value: NO GROWTH     Performed at [facility]   Culture     Final   Value: NO GROWTH     Performed at [facility]   Report Status 02/04/2013 FINAL   Final  SURGICAL PCR SCREEN     Status: None   Collection Time    02/03/13  7:06 AM      Result Value Range Status   MRSA, PCR NEGATIVE  NEGATIVE Final   Staphylococcus aureus NEGATIVE  NEGATIVE Final   Comment:            The Xpert SA Assay (FDA     approved for NASAL specimens     in patients over 63 years of age),     is one component of  a comprehensive surveillance     program.  Test performance has     been validated by [facility] for patients greater     than or equal to 49 year old.     It is not intended     to diagnose infection nor to     guide or monitor treatment.     Labs: Basic Metabolic Panel:  Recent Labs Lab 02/02/13 1440 02/03/13 0633 02/04/13 0519 02/05/13 0528  NA 136 135 137 137  K 3.4* 3.4* 3.2* 3.0*  CL 100 100 98 99  CO2 28 27 30 30   GLUCOSE 128* 112* 122* 144*  BUN 18 17 12 12   CREATININE 1.44* 1.23 1.10 1.15  CALCIUM 9.1 8.8 9.1 9.0   CBC:  Recent Labs Lab 02/02/13 1440 02/03/13 0633 02/04/13 0519 02/05/13 0528  WBC 16.8* 11.1* 10.5 8.2  HGB 14.3 12.8* 13.4 13.3  HCT 41.7 37.5* 39.5 38.7*  MCV 93.3 93.3 94.0 92.6  PLT 213 184 196 213    Principal Problem:   Hydronephrosis with ureteral calculus Active Problems:   Renal colic on left side   Hydronephrosis   Time coordinating discharge: 25 minutes  Signed:  Brendia Sacks, MD [facility] 02/06/2013, 4:32 PM

## 2013-02-06 NOTE — Progress Notes (Signed)
[facility] PROGRESS NOTE  Grant [name] ZOX:[id number] DOB: 07-05-1965 DOA: 02/02/2013 PCP: No PCP Per Patient  Assessment/Plan: 1. Left ureteral calculus with hydronephrosis: Associated with pain. Status post double-J stent placement. For ESL today. 2. Acute renal failure: Resolved with supportive care and stent placement.  3. Leukocytosis: Resolved. Urine culture was negative.   ESL today, anticipate discharge later today  Pending studies:     Code Status: full code DVT prophylaxis: SCDs Family Communication: Status with wife, mother, and aunt at bedside Disposition Plan: home when improved  Brendia Sacks, MD  [facility]  Pager [phone] If 7PM-7AM, please contact night-coverage at [email or web address], password [facility] 02/06/2013, 11:40 AM  LOS: 4 days   Summary: 47 year old man presented with left groin and flank pain. CT scan showed left ureteral calculus with hydronephrosis. Admitted for same, urology consultation. He underwent double-J stent placement and discharge home was recommended, however the patient continued to have pain and therefore ESL is planned.  Consultants:  Urology   Procedures:  10/2 Cystoscopy, insertion of double-J stent size 5-French 24 cm, no string attached.  Antibiotics:  Ciprofloxacin 10/12 >>   HPI/Subjective: Overall feels okay. Still has some flank pain.  Objective: Filed Vitals:   02/05/13 0458 02/05/13 1445 02/05/13 2135 02/06/13 0450  BP: 123/71 122/74 122/83 125/80  Pulse: 60 62 88 69  Temp: 98.1 F (36.7 C) 98.5 F (36.9 C) 98.3 F (36.8 C) 98.4 F (36.9 C)  TempSrc: Oral Oral Oral Oral  Resp: 19 18 18 18   Height:      Weight:      SpO2: 97% 98% 97% 96%    Intake/Output Summary (Last 24 hours) at 02/06/13 1140 Last data filed at 02/06/13 0900  Gross per 24 hour  Intake   1440 ml  Output   1325 ml  Net    115 ml     Filed Weights   02/02/13 1720  Weight: 95.255 kg (210 lb)     Exam:   Afebrile, vital signs stable  General: Appears calm and comfortable. Speech fluent and clear.  Cardiovascular: Regular rate and rhythm. No murmur, rub, gallop.  Respiratory: Clear to auscultation bilaterally. No wheezes, rales, rhonchi. Normal respiratory effort.  Abdomen: Soft, nontender, nondistended.  Data Reviewed:  No labs today  Scheduled Meds: . ciprofloxacin  200 mg Intravenous Q12H  . heparin  5,000 Units Subcutaneous Q8H  .  HYDROmorphone (DILAUDID) injection  1 mg Intravenous Once  . pantoprazole (PROTONIX) IV  40 mg Intravenous Q24H  . tamsulosin  0.4 mg Oral Daily   Continuous Infusions: . dextrose 5 % and 0.45% NaCl 100 mL/hr at 02/06/13 0447    Principal Problem:   Hydronephrosis with ureteral calculus Active Problems:   Renal colic on left side   Hydronephrosis   Time spent 15 minutes

## 2013-02-06 NOTE — Care Management Note (Signed)
    Page 1 of 1   02/06/2013     1:24:02 PM   CARE MANAGEMENT NOTE 02/06/2013  Patient:  Grant, [name]   Account Number:  [id number]  Date Initiated:  02/03/2013  Documentation initiated by:  Lanier Clam  Subjective/Objective Assessment:   47 y/o m admitted w/l renal colic.     Action/Plan:   From home.   Anticipated DC Date:  02/05/2013   Anticipated DC Plan:  HOME/SELF CARE         Choice offered to / List presented to:             Status of service:  Completed, signed off Medicare Important Message given?   (If response is "NO", the following Medicare IM given date fields will be blank) Date Medicare IM given:   Date Additional Medicare IM given:    Discharge Disposition:    Per UR Regulation:  Reviewed for med. necessity/level of care/duration of stay  If discussed at Long Length of Stay Meetings, dates discussed:    Comments:  02/06/13 Grant Holms RN BSN CM Lithotripsy today, anticipate DC home soon.

## 2013-02-07 ENCOUNTER — Encounter (HOSPITAL_COMMUNITY): Payer: Self-pay | Admitting: Urology

## 2013-02-13 ENCOUNTER — Ambulatory Visit (HOSPITAL_COMMUNITY)
Admission: RE | Admit: 2013-02-13 | Discharge: 2013-02-13 | Disposition: A | Discharge status: Home / Self Care | Payer: Self-pay | Source: Ambulatory Visit | Attending: Urology | Admitting: Urology

## 2013-02-13 ENCOUNTER — Other Ambulatory Visit (HOSPITAL_COMMUNITY): Payer: Self-pay | Admitting: Urology

## 2013-02-13 DIAGNOSIS — N201 Calculus of ureter: Secondary | ICD-10-CM | POA: Insufficient documentation

## 2014-10-29 IMAGING — CR DG ABDOMEN 1V
1 series · 1 of 1 positions shown · non-contrast
Comparison: CT of the abdomen and pelvis performed earlier today at
[DATE] p.m.

CLINICAL DATA: Left flank and back pain.  Follow up distal ureteral
stone.

ABDOMEN - 1 VIEW

[ap portable]
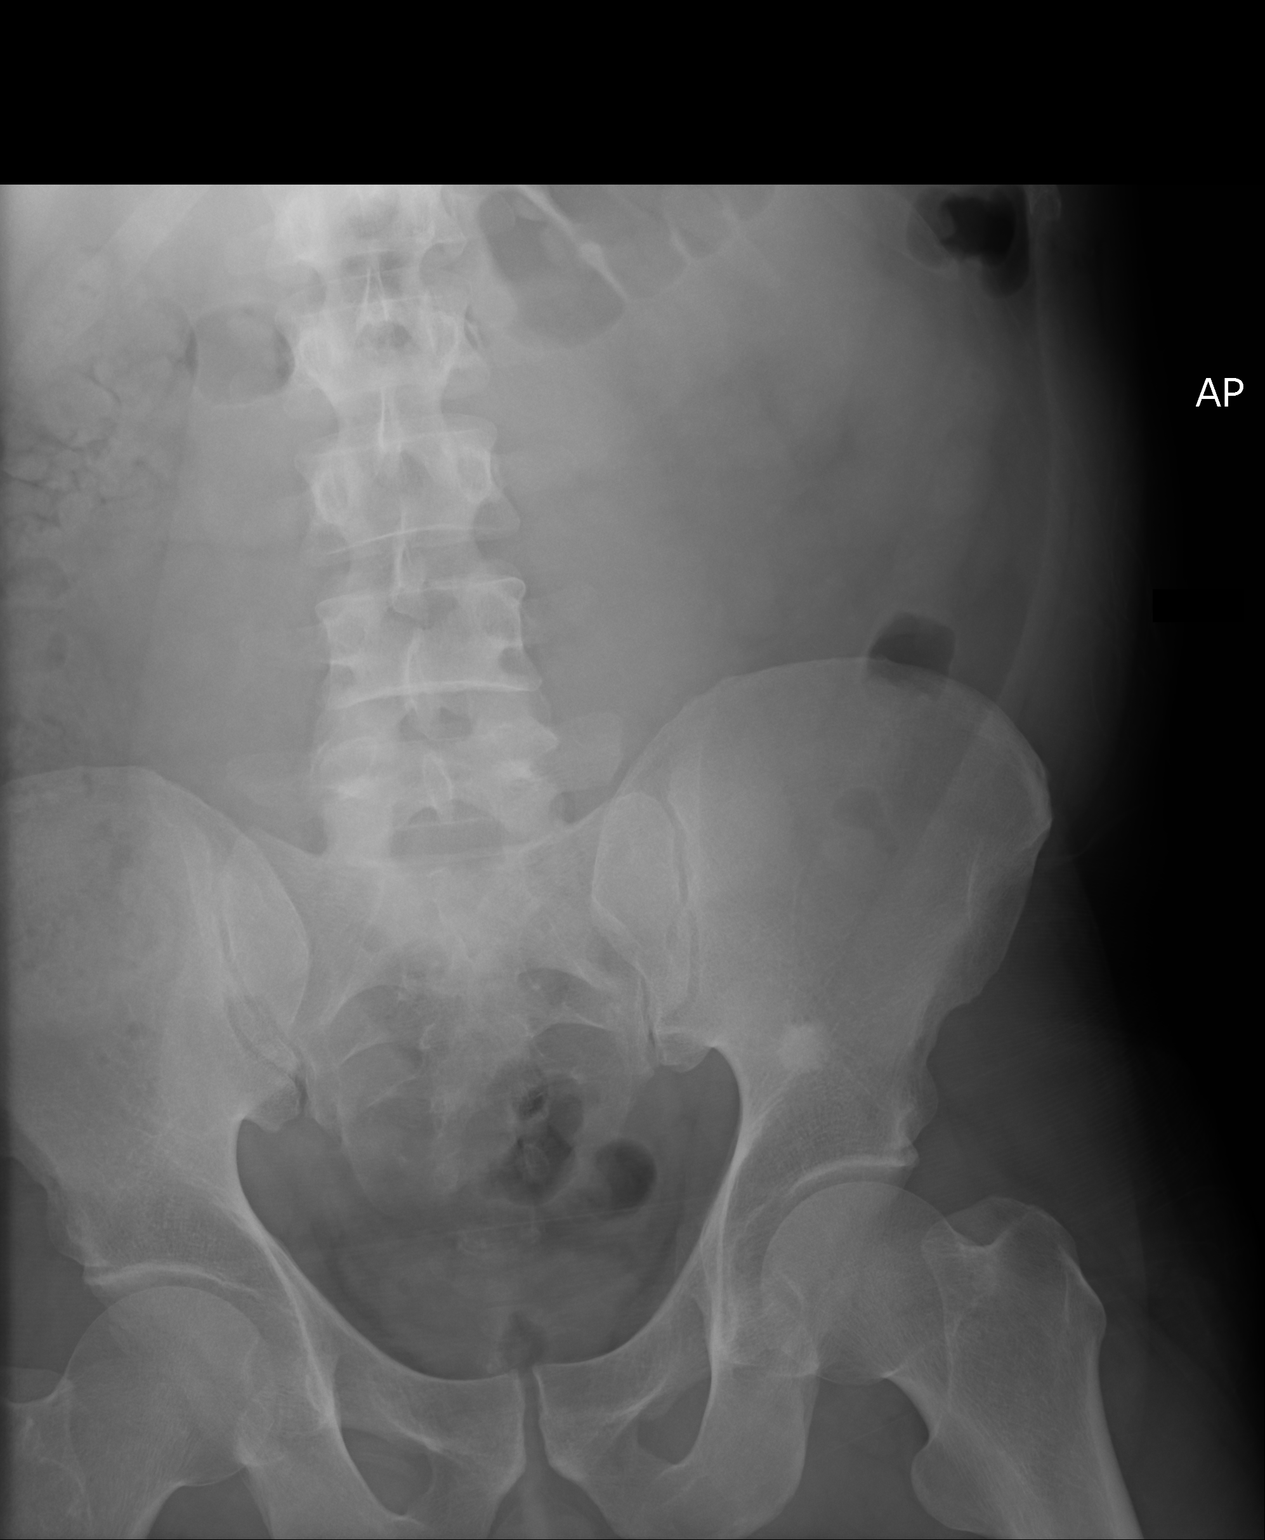

[1 of 1 positions shown; findings below may reference images not displayed]

FINDINGS: The 8 mm stone noted in the distal left ureter on the
prior study is not well characterized, as it overlies the sacrum.
The smaller right renal stones are also not visualized. The
visualized bowel gas pattern is grossly unremarkable.

No acute osseous abnormalities are seen.  A bone island is noted at
the left ischium, unchanged from 4854.
IMPRESSION: Known 8 mm obstructing stone in the distal left ureter is not well
characterized on this study, as it overlies the sacrum.

## 2014-10-30 IMAGING — RF DG C-ARM 61-120 MIN
1 series · 2 of 2 positions shown · non-contrast
Comparison: Abdominal radiograph 02/02/2013.

FLUOROSCOPY TIME:  1 minute and 13 seconds.

CLINICAL DATA: Left ureteral stent placement.

EXAM:
DG C-ARM 1-60 MIN
TECHNIQUE: Intraoperative fluoroscopic views of the abdomen were obtained.

[Series 1: run · 2 of 2 slices shown]
[im 1/2]
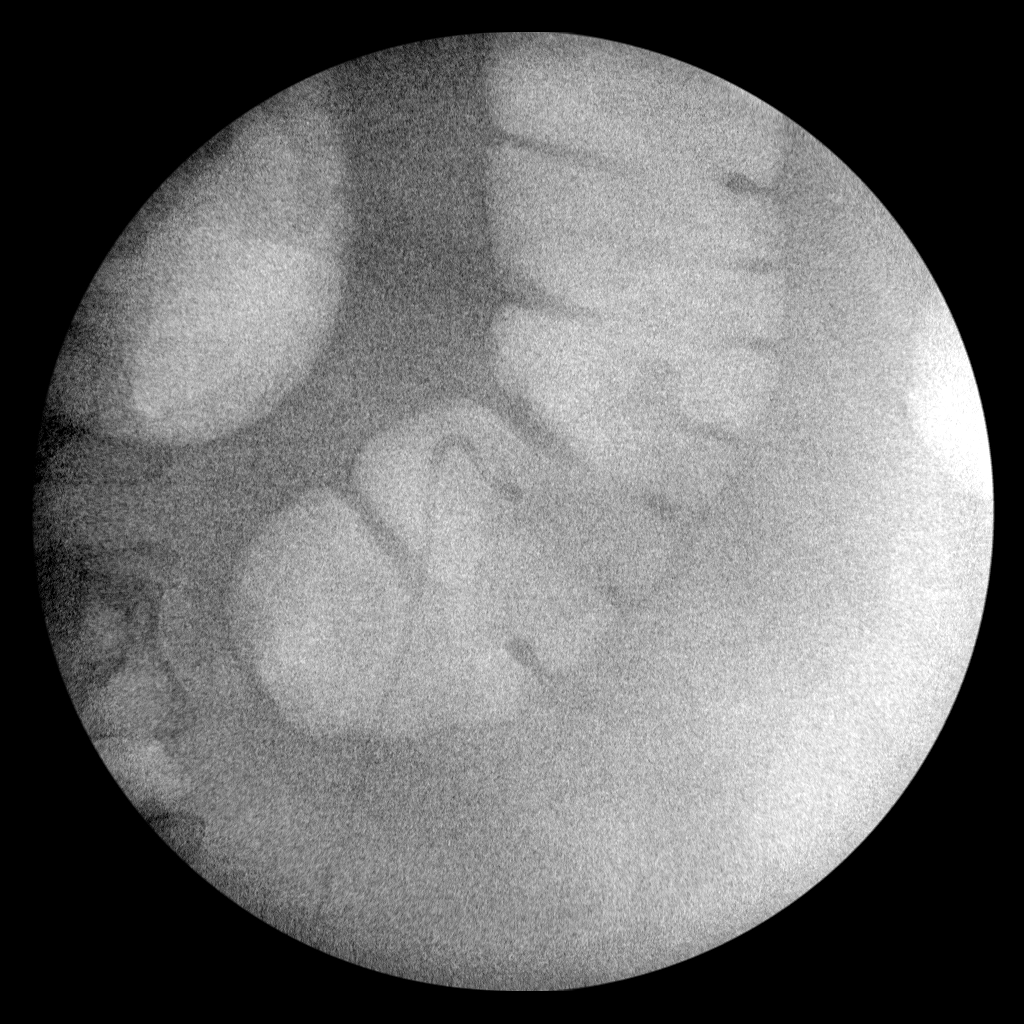
[im 2/2]
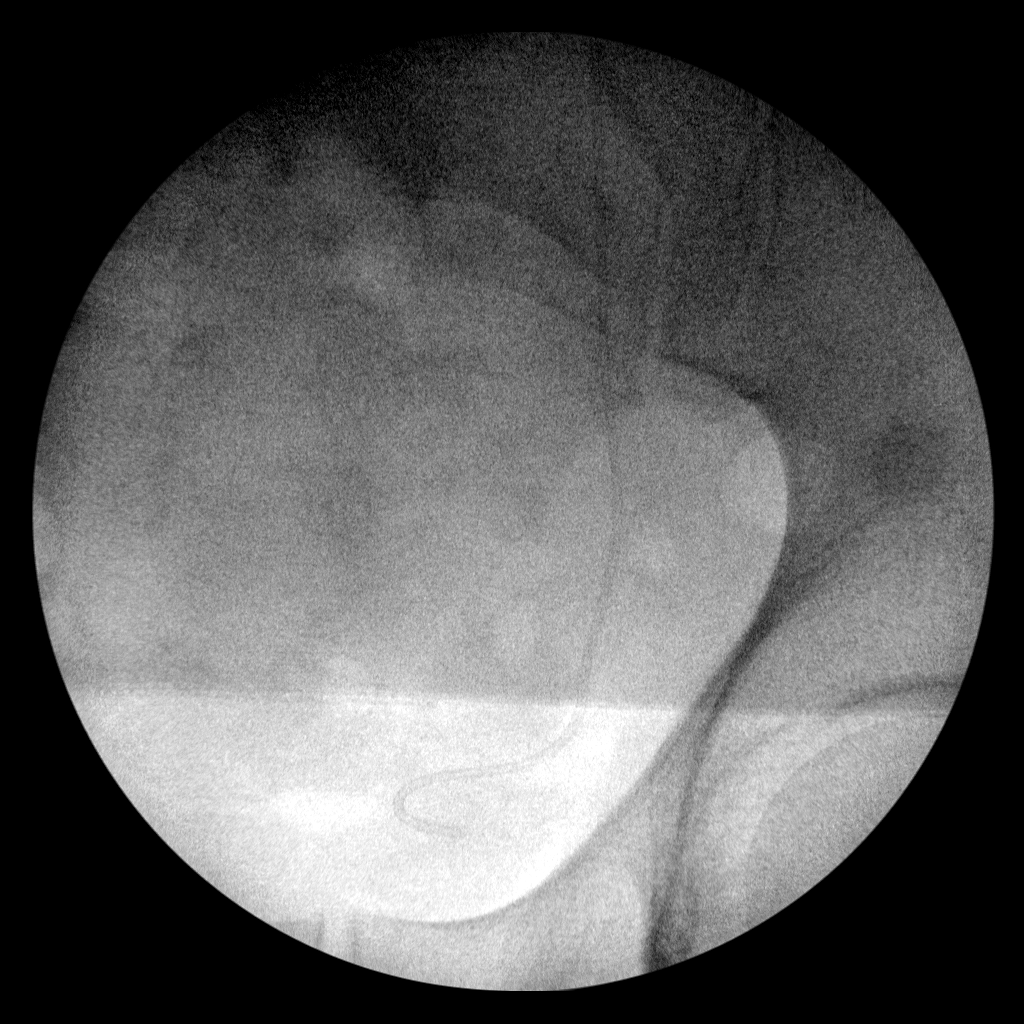

[2 of 2 positions shown; findings below may reference images not displayed]

FINDINGS: Two intraoperative fluoroscopic spot views of the left side of the
abdomen appeared demonstrate placement of a left-sided double-J
ureteral stent with approximately of reformed in the left renal
pelvis and distally performed in the urinary bladder.
IMPRESSION: Intraoperative documentation of left double-J ureteral stent
placement, as above.

## 2014-10-31 IMAGING — CR DG ABDOMEN 1V
1 series · 1 of 1 positions shown · non-contrast
Comparison: CT 02/02/2013

CLINICAL DATA: Left kidney stone.

EXAM:
ABDOMEN - 1 VIEW

[view not recorded]
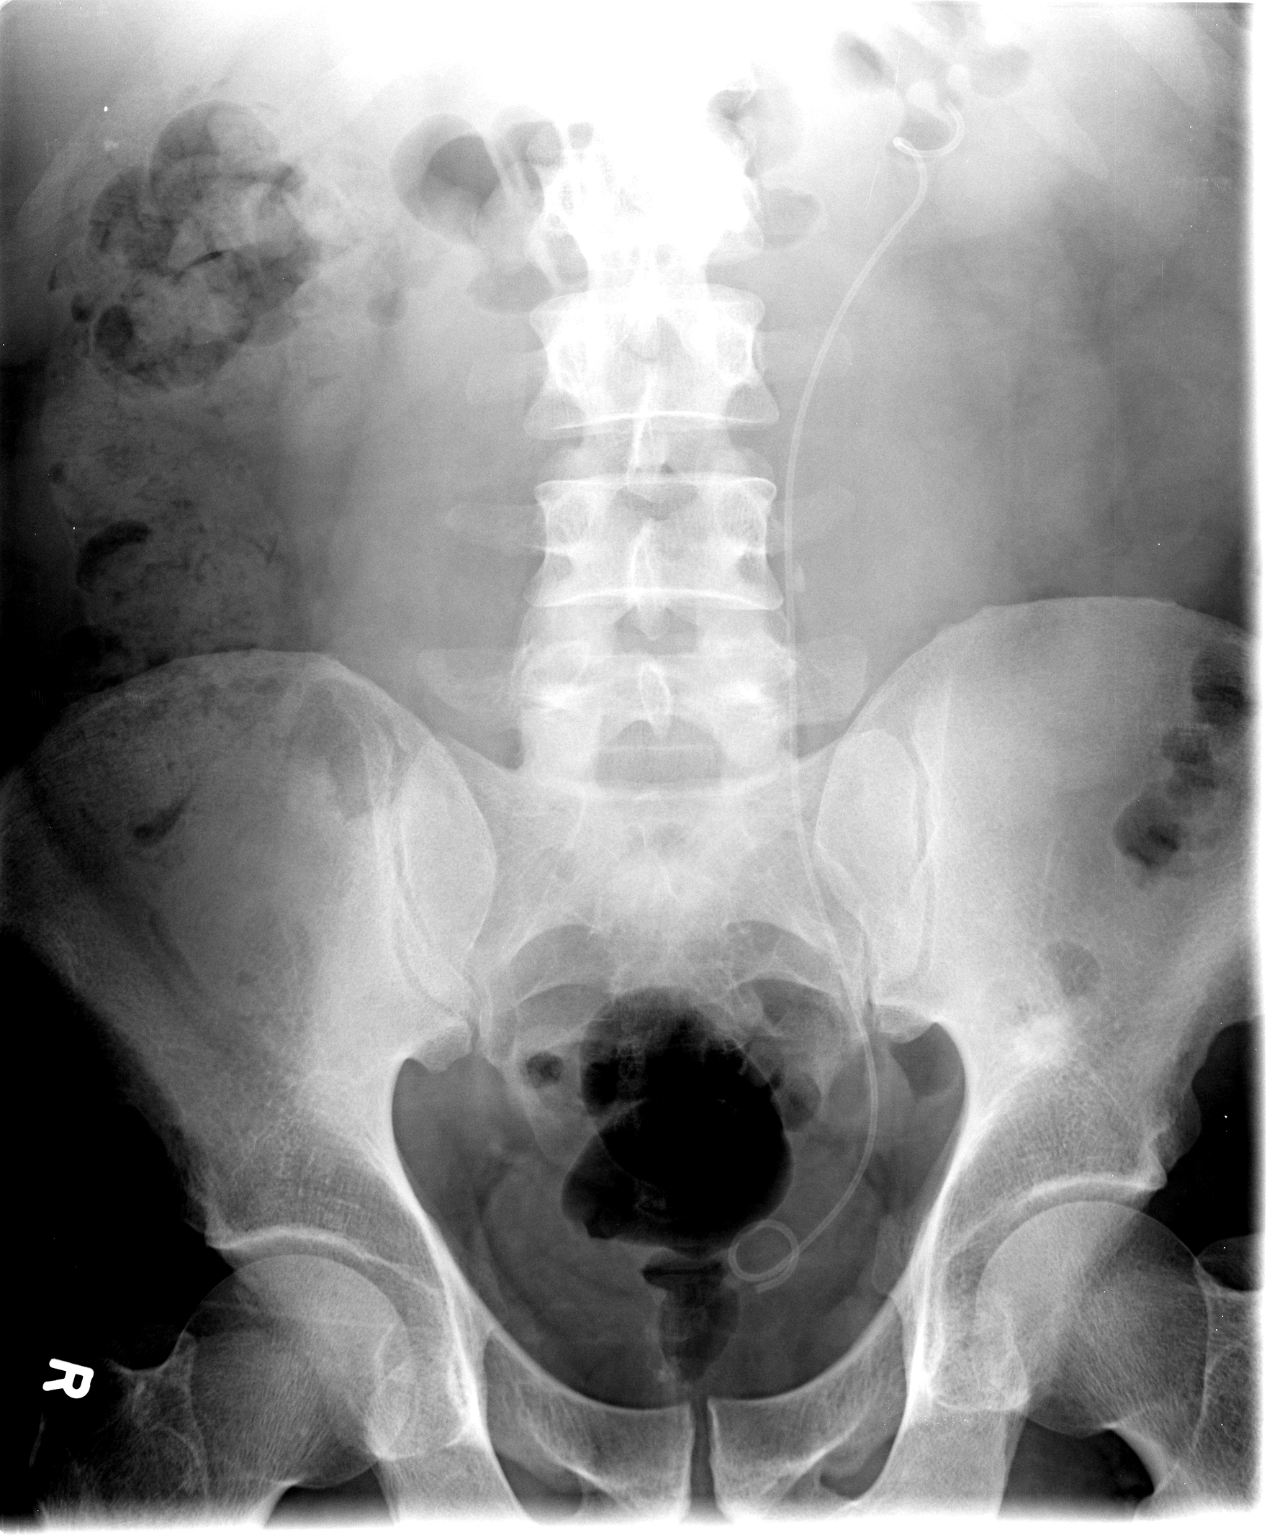

[1 of 1 positions shown; findings below may reference images not displayed]

FINDINGS: Left ureteral stent is in place. Calcification projects adjacent to
the mid portion of the left ureteral stent along the inferior L4
vertebral body, likely the previously seen left ureteral stone. No
additional suspicious calcifications. Nonobstructive bowel gas
pattern.
IMPRESSION: Mid left ureteral stone. Left ureteral stent in place.

## 2015-06-10 ENCOUNTER — Encounter: Payer: Self-pay | Admitting: Internal Medicine

## 2020-12-06 ENCOUNTER — Emergency Department (HOSPITAL_COMMUNITY): Payer: Self-pay

## 2020-12-06 ENCOUNTER — Emergency Department (HOSPITAL_COMMUNITY)
Admission: EM | Admit: 2020-12-06 | Discharge: 2020-12-06 | Disposition: A | Discharge status: Home / Self Care | Payer: Self-pay | Attending: Emergency Medicine | Admitting: Emergency Medicine

## 2020-12-06 ENCOUNTER — Other Ambulatory Visit: Payer: Self-pay

## 2020-12-06 DIAGNOSIS — Y92410 Unspecified street and highway as the place of occurrence of the external cause: Secondary | ICD-10-CM | POA: Insufficient documentation

## 2020-12-06 DIAGNOSIS — M545 Low back pain, unspecified: Secondary | ICD-10-CM | POA: Insufficient documentation

## 2020-12-06 DIAGNOSIS — M62830 Muscle spasm of back: Secondary | ICD-10-CM | POA: Insufficient documentation

## 2020-12-06 DIAGNOSIS — M62838 Other muscle spasm: Secondary | ICD-10-CM

## 2020-12-06 MED ORDER — HYDROCODONE-ACETAMINOPHEN 5-325 MG PO TABS
1.0000 | ORAL_TABLET | Freq: Once | ORAL | Status: AC
  Administered 2020-12-06: 1 via ORAL
  Filled 2020-12-06: qty 1

## 2020-12-06 MED ORDER — LIDOCAINE 5 % EX PTCH
1.0000 | MEDICATED_PATCH | CUTANEOUS | Status: DC
  Administered 2020-12-06: 1 via TRANSDERMAL
  Filled 2020-12-06: qty 1

## 2020-12-06 MED ORDER — IBUPROFEN 200 MG PO TABS
600.0000 mg | ORAL_TABLET | Freq: Once | ORAL | Status: AC
  Administered 2020-12-06: 600 mg via ORAL
  Filled 2020-12-06: qty 3

## 2020-12-06 MED ORDER — METHOCARBAMOL 500 MG PO TABS: 500.0000 mg | ORAL_TABLET | Freq: Two times a day (BID) | ORAL | 0 refills | Status: AC

## 2020-12-06 MED ORDER — IBUPROFEN 800 MG PO TABS: 800.0000 mg | ORAL_TABLET | Freq: Three times a day (TID) | ORAL | 0 refills | Status: DC

## 2020-12-06 NOTE — ED Notes (Signed)
Provider at the bedside for re-eval.
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
## 2021-07-03 ENCOUNTER — Ambulatory Visit
Admission: EM | Admit: 2021-07-03 | Discharge: 2021-07-03 | Disposition: A | Discharge status: Home / Self Care | Payer: Self-pay | Attending: Family Medicine | Admitting: Family Medicine

## 2021-07-03 ENCOUNTER — Encounter: Payer: Self-pay | Admitting: Emergency Medicine

## 2021-07-03 ENCOUNTER — Other Ambulatory Visit: Payer: Self-pay

## 2021-07-03 DIAGNOSIS — J069 Acute upper respiratory infection, unspecified: Secondary | ICD-10-CM

## 2021-07-03 MED ORDER — PROMETHAZINE-DM 6.25-15 MG/5ML PO SYRP: 5.0000 mL | ORAL_SOLUTION | Freq: Four times a day (QID) | ORAL | 0 refills | Status: AC | PRN

## 2021-07-03 MED ORDER — PREDNISONE 20 MG PO TABS: 40.0000 mg | ORAL_TABLET | Freq: Every day | ORAL | 0 refills | Status: DC

## 2021-07-03 NOTE — ED Triage Notes (Signed)
Pt reports cough, sore throat, sinus congestion x2 days.pt wife was seen for same last weekend and reports was diagnosed with pneumonia and reports "he is starting out like me."  ? ?Pt denies known fever, gi/gu symptoms. Does not wish to be covid tested at this time. ?

## 2021-07-03 NOTE — ED Provider Notes (Signed)
?[facility] ? ? ? ?CSN: [id number] ?Arrival date & time: 07/03/21  0946 ? ? ?  ? ?History   ?Chief Complaint ?Chief Complaint  ?Patient presents with  ? Cough  ? ? ?HPI ?Grant [name] is a 56 y.o. male.  ? ?Presenting today with 2-day history of cough, sore throat, sinus congestion, fatigue, body aches.  Denies chest pain, shortness of breath, abdominal pain, nausea vomiting or diarrhea.  So far not taking anything over-the-counter for symptoms.  Declines viral testing today.  States wife was sick with similar symptoms last week and ended up with pneumonia.  No known history of chronic pulmonary disease per patient. ? ? ?Past Medical History:  ?Diagnosis Date  ? Kidney stone   ? ? ?Patient Active Problem List  ? Diagnosis Date Noted  ? Renal colic on left side 10/23/1599  ? Hydronephrosis 02/02/2013  ? Hydronephrosis with ureteral calculus 02/02/2013  ? ABSCESS, PERIRECTAL 06/14/2010  ? RECTAL MASS 06/14/2010  ? ? ?Past Surgical History:  ?Procedure Laterality Date  ? ABDOMINAL SURGERY    ? CYSTOSCOPY W/ URETERAL STENT PLACEMENT Left 02/03/2013  ? Procedure: CYSTOSCOPY WITH RETROGRADE PYELOGRAM/URETERAL STENT PLACEMENT;  Surgeon: Marissa Nestle, MD;  Location: [facility];  Service: Urology;  Laterality: Left;  ? EXTRACORPOREAL SHOCK WAVE LITHOTRIPSY Left 02/06/2013  ? Procedure: EXTRACORPOREAL SHOCK WAVE LITHOTRIPSY (ESWL) URETERAL CALCULUS;  Surgeon: Marissa Nestle, MD;  Location: [facility];  Service: Urology;  Laterality: Left;  ? KNEE SURGERY    ? ? ? ? ? ?Home Medications   ? ?Prior to Admission medications   ?Medication Sig Start Date End Date Taking? Authorizing Provider  ?predniSONE (DELTASONE) 20 MG tablet Take 2 tablets (40 mg total) by mouth daily with breakfast. 07/03/21  Yes Volney American, PA-C  ?promethazine-dextromethorphan (PROMETHAZINE-DM) 6.25-15 MG/5ML syrup Take 5 mLs by mouth 4 (four) times daily as needed. 07/03/21  Yes Volney American, PA-C  ?HYDROmorphone (DILAUDID) 4  MG tablet Take 1 tablet (4 mg total) by mouth every 3 (three) hours as needed (severe pain). 02/06/13   Samuella Cota, MD  ?ibuprofen (ADVIL) 800 MG tablet Take 1 tablet (800 mg total) by mouth 3 (three) times daily. 0/93/23   Lianne Cure, DO  ?methocarbamol (ROBAXIN) 500 MG tablet Take 1 tablet (500 mg total) by mouth 2 (two) times daily. 5/57/32   Lianne Cure, DO  ?tamsulosin (FLOMAX) 0.4 MG CAPS capsule Take 0.4 mg by mouth daily.    [provider]  ? ? ?Family History ?History reviewed. No pertinent family history. ? ?Social History ?Social History  ? ?Tobacco Use  ? Smoking status: Never  ?Substance Use Topics  ? Alcohol use: No  ? Drug use: Yes  ?  Types: Marijuana  ?  Comment: last used 01/30/13  ? ? ? ?Allergies   ?Ivp dye [iodinated contrast media] ? ? ?Review of Systems ?Review of Systems ?Per HPI ? ?Physical Exam ?Triage Vital Signs ?ED Triage Vitals  ?Enc Vitals Group  ?   BP 07/03/21 1107 (!) 144/82  ?   Pulse Rate 07/03/21 1107 61  ?   Resp 07/03/21 1107 18  ?   Temp 07/03/21 1107 98.6 ?F (37 ?C)  ?   Temp Source 07/03/21 1107 Oral  ?   SpO2 07/03/21 1107 96 %  ?   Weight 07/03/21 1108 215 lb (97.5 kg)  ?   Height 07/03/21 1108 6' (1.829 m)  ?   Head Circumference --   ?  Peak Flow --   ?   Pain Score 07/03/21 1108 5  ?   Pain Loc --   ?   Pain Edu? --   ?   Excl. in [place]? --   ? ?No data found. ? ?Updated Vital Signs ?BP (!) 144/82 (BP Location: Right Arm)   Pulse 61   Temp 98.6 ?F (37 ?C) (Oral)   Resp 18   Ht 6' (1.829 m)   Wt 215 lb (97.5 kg)   SpO2 96%   BMI 29.16 kg/m?  ? ?Visual Acuity ?Right Eye Distance:   ?Left Eye Distance:   ?Bilateral Distance:   ? ?Right Eye Near:   ?Left Eye Near:    ?Bilateral Near:    ? ?Physical Exam ?Vitals and nursing note reviewed.  ?Constitutional:   ?   Appearance: Normal appearance. He is well-developed.  ?HENT:  ?   Head: Atraumatic.  ?   Right Ear: External ear normal.  ?   Left Ear: External ear normal.  ?   Nose: Rhinorrhea present.   ?   Mouth/Throat:  ?   Pharynx: Posterior oropharyngeal erythema present. No oropharyngeal exudate.  ?Eyes:  ?   Extraocular Movements: Extraocular movements intact.  ?   Conjunctiva/sclera: Conjunctivae normal.  ?   Pupils: Pupils are equal, round, and reactive to light.  ?Cardiovascular:  ?   Rate and Rhythm: Normal rate and regular rhythm.  ?Pulmonary:  ?   Effort: Pulmonary effort is normal. No respiratory distress.  ?   Breath sounds: Normal breath sounds. No wheezing or rales.  ?Musculoskeletal:     ?   General: Normal range of motion.  ?   Cervical back: Normal range of motion and neck supple.  ?Lymphadenopathy:  ?   Cervical: No cervical adenopathy.  ?Skin: ?   General: Skin is warm and dry.  ?Neurological:  ?   General: No focal deficit present.  ?   Mental Status: He is alert and oriented to person, place, and time.  ?Psychiatric:     ?   Mood and Affect: Mood normal.     ?   Behavior: Behavior normal.     ?   Thought Content: Thought content normal.     ?   Judgment: Judgment normal.  ? ? ? ?UC Treatments / Results  ?Labs ?(all labs ordered are listed, but only abnormal results are displayed) ?Labs Reviewed - No data to display ? ?EKG ? ? ?Radiology ?No results found. ? ?Procedures ?Procedures (including critical care time) ? ?Medications Ordered in UC ?Medications - No data to display ? ?Initial Impression / Assessment and Plan / UC Course  ?I have reviewed the triage vital signs and the nursing notes. ? ?Pertinent labs & imaging results that were available during my care of the patient were reviewed by me and considered in my medical decision making (see chart for details). ? ?  ? ?Lungs clear to auscultation bilaterally and oxygen saturation 96% on room air.  Suspect viral upper respiratory infection causing symptoms.  He declines viral testing today.  We will treat with prednisone, Phenergan DM, supportive over-the-counter medications and home care.  Return for acutely worsening symptoms. ? ?Final  Clinical Impressions(s) / UC Diagnoses  ? ?Final diagnoses:  ?Viral URI with cough  ? ?Discharge Instructions   ?None ?  ? ?ED Prescriptions   ? ? Medication Sig Dispense Auth. Provider  ? predniSONE (DELTASONE) 20 MG tablet Take 2 tablets (40 mg total) by  mouth daily with breakfast. 10 tablet Volney American, PA-C  ? promethazine-dextromethorphan (PROMETHAZINE-DM) 6.25-15 MG/5ML syrup Take 5 mLs by mouth 4 (four) times daily as needed. 100 mL Volney American, [place]  ? ?  ? ?PDMP not reviewed this encounter. ?  ?Volney American, PA-C ?07/03/21 1212 ? ?

## 2023-10-26 ENCOUNTER — Telehealth: Payer: Self-pay | Admitting: Emergency Medicine

## 2023-10-26 ENCOUNTER — Ambulatory Visit: Payer: MEDICAID

## 2023-10-26 ENCOUNTER — Ambulatory Visit
Admission: EM | Admit: 2023-10-26 | Discharge: 2023-10-26 | Disposition: A | Discharge status: Home / Self Care | Payer: MEDICAID | Attending: Family Medicine | Admitting: Family Medicine

## 2023-10-26 ENCOUNTER — Ambulatory Visit: Payer: Self-pay | Admitting: Family Medicine

## 2023-10-26 DIAGNOSIS — M79671 Pain in right foot: Secondary | ICD-10-CM

## 2023-10-26 MED ORDER — PREDNISONE 20 MG PO TABS: 40.0000 mg | ORAL_TABLET | Freq: Every day | ORAL | 0 refills | Status: DC

## 2023-10-26 MED ORDER — PREDNISONE 20 MG PO TABS: 40.0000 mg | ORAL_TABLET | Freq: Every day | ORAL | 0 refills | Status: AC

## 2023-10-26 NOTE — ED Triage Notes (Signed)
 Pt reports pain in the right foot near the arch. Per wife he came home from work on Monday with pain in the food and it has progressed over the course of the week.

## 2023-10-26 NOTE — Discharge Instructions (Signed)
 I have prescribed a course of prednisone  for pain and inflammation.  You may also do warm Epsom salt soaks, stretches, wear supportive shoes, Tylenol  as needed.  Follow-up with [facility] either the [place] or [place] location if your symptoms worsen or do not resolve.  We will let you know if anything comes back abnormal on your foot x-ray.

## 2023-10-26 NOTE — ED Provider Notes (Signed)
 [facility]    CSN: [id number] Arrival date & time: 10/26/23  1805      History   Chief Complaint No chief complaint on file.   HPI Grant [name] is a 58 y.o. male.   Patient presenting today with 5-day history of right foot pain near the medial aspect at the arch and to the right great toe.  States the area has been swollen but not discolored.  No known injury, states it started hurting when he got home from work on Monday.  Denies loss of range of motion, numbness, tingling, skin changes, fevers.  So far not trying anything other than ice for symptoms.    Past Medical History:  Diagnosis Date   Kidney stone     Patient Active Problem List   Diagnosis Date Noted   Renal colic on left side 02/02/2013   Hydronephrosis 02/02/2013   Hydronephrosis with ureteral calculus 02/02/2013   ABSCESS, PERIRECTAL 06/14/2010   RECTAL MASS 06/14/2010    Past Surgical History:  Procedure Laterality Date   ABDOMINAL SURGERY     CYSTOSCOPY W/ URETERAL STENT PLACEMENT Left 02/03/2013   Procedure: CYSTOSCOPY WITH RETROGRADE PYELOGRAM/URETERAL STENT PLACEMENT;  Surgeon: Emery LILLETTE Blaze, MD;  Location: [facility];  Service: Urology;  Laterality: Left;   EXTRACORPOREAL SHOCK WAVE LITHOTRIPSY Left 02/06/2013   Procedure: EXTRACORPOREAL SHOCK WAVE LITHOTRIPSY (ESWL) URETERAL CALCULUS;  Surgeon: Emery LILLETTE Blaze, MD;  Location: [facility];  Service: Urology;  Laterality: Left;   KNEE SURGERY         Home Medications    Prior to Admission medications   Medication Sig Start Date End Date Taking? Authorizing Provider  HYDROmorphone  (DILAUDID ) 4 MG tablet Take 1 tablet (4 mg total) by mouth every 3 (three) hours as needed (severe pain). 02/06/13   Jadine Toribio SQUIBB, MD  ibuprofen  (ADVIL ) 800 MG tablet Take 1 tablet (800 mg total) by mouth 3 (three) times daily. 12/06/20   Elnor Hila P, DO  methocarbamol  (ROBAXIN ) 500 MG tablet Take 1 tablet (500 mg total) by mouth 2 (two) times  daily. 12/06/20   Elnor Hila SQUIBB, DO  predniSONE  (DELTASONE ) 20 MG tablet Take 2 tablets (40 mg total) by mouth daily with breakfast. 10/26/23   Stuart Vernell Norris, PA-C  promethazine -dextromethorphan (PROMETHAZINE -DM) 6.25-15 MG/5ML syrup Take 5 mLs by mouth 4 (four) times daily as needed. 07/03/21   Stuart Vernell Norris, PA-C  tamsulosin  (FLOMAX ) 0.4 MG CAPS capsule Take 0.4 mg by mouth daily.    [provider]    Family History History reviewed. No pertinent family history.  Social History Social History   Tobacco Use   Smoking status: Never  Substance Use Topics   Alcohol use: No   Drug use: Yes    Types: Marijuana    Comment: last used 01/30/13     Allergies   Ivp dye [iodinated contrast media]   Review of Systems Review of Systems Per HPI  Physical Exam Triage Vital Signs ED Triage Vitals  Encounter Vitals Group     BP 10/26/23 1815 121/82     Girls Systolic BP Percentile --      Girls Diastolic BP Percentile --      Boys Systolic BP Percentile --      Boys Diastolic BP Percentile --      Pulse Rate 10/26/23 1815 63     Resp 10/26/23 1815 18     Temp 10/26/23 1815 97.7 F (36.5 C)     Temp  Source 10/26/23 1815 Oral     SpO2 10/26/23 1815 96 %     Weight --      Height --      Head Circumference --      Peak Flow --      Pain Score 10/26/23 1820 8     Pain Loc --      Pain Education --      Exclude from Growth Chart --    No data found.  Updated Vital Signs BP 121/82 (BP Location: Right Arm)   Pulse 63   Temp 97.7 F (36.5 C) (Oral)   Resp 18   SpO2 96%   Visual Acuity Right Eye Distance:   Left Eye Distance:   Bilateral Distance:    Right Eye Near:   Left Eye Near:    Bilateral Near:     Physical Exam Vitals and nursing note reviewed.  Constitutional:      Appearance: Normal appearance.  HENT:     Head: Atraumatic.  Eyes:     Extraocular Movements: Extraocular movements intact.     Conjunctiva/sclera: Conjunctivae  normal.  Cardiovascular:     Rate and Rhythm: Normal rate and regular rhythm.  Pulmonary:     Effort: Pulmonary effort is normal.     Breath sounds: Normal breath sounds.  Musculoskeletal:        General: Swelling and tenderness present. No deformity or signs of injury. Normal range of motion.     Cervical back: Normal range of motion and neck supple.     Comments: Localized area of edema to the medial midfoot with tenderness to palpation in this area extending to the right great toe.  No bony deformity palpable, range of motion intact  Skin:    General: Skin is warm and dry.     Findings: No bruising, erythema or lesion.  Neurological:     General: No focal deficit present.     Mental Status: He is oriented to person, place, and time.     Comments: Right lower extremity neurovascularly intact  Psychiatric:        Mood and Affect: Mood normal.        Thought Content: Thought content normal.        Judgment: Judgment normal.      UC Treatments / Results  Labs (all labs ordered are listed, but only abnormal results are displayed) Labs Reviewed - No data to display  EKG   Radiology DG Foot Complete Right Result Date: 10/26/2023 CLINICAL DATA:  pain in  the foot since monday EXAM: RIGHT FOOT COMPLETE - 3+ VIEW COMPARISON:  None Available. FINDINGS: There is no evidence of fracture or dislocation. 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 Plantar calcaneal spur. There is no evidence of arthropathy or other focal bone abnormality. Soft tissues are unremarkable. Vascular calcification. Linear 5 mm density along the plantar aspect of the hindfoot. IMPRESSION: Linear 5 mm density along the plantar aspect of the hindfoot. Finding could represent a retained radiopaque foreign body. Correlate clinically. Electronically Signed   By: Morgane  Naveau M.D.   On: 10/26/2023 19:20    Procedures Procedures (including critical care time)  Medications Ordered in UC Medications - No data to display  Initial  Impression / Assessment and Plan / UC Course  I have reviewed the triage vital signs and the nursing notes.  Pertinent labs & imaging results that were available during my care of the patient were reviewed by me and considered in my medical decision  making (see chart for details).     X-ray of the right foot showing no bony abnormality, however a possible foreign body was noted to the plantar aspect of the hindfoot.  Unclear if this is any bearing on current symptoms.  Will treat with prednisone , stretches, heat, massage, Epsom salt soaks and follow-up with podiatry if worsening or not resolving.  Final Clinical Impressions(s) / UC Diagnoses   Final diagnoses:  Right foot pain     Discharge Instructions      I have prescribed a course of prednisone  for pain and inflammation.  You may also do warm Epsom salt soaks, stretches, wear supportive shoes, Tylenol  as needed.  Follow-up with [facility] either the [place] or [place] location if your symptoms worsen or do not resolve.  We will let you know if anything comes back abnormal on your foot x-ray.    ED Prescriptions     Medication Sig Dispense Auth. Provider   predniSONE  (DELTASONE ) 20 MG tablet Take 2 tablets (40 mg total) by mouth daily with breakfast. 10 tablet Stuart Vernell Norris, PA-C      PDMP not reviewed this encounter.   Stuart Vernell Norris, [place] 10/26/23 1956

## 2023-10-26 NOTE — Telephone Encounter (Signed)
 Pt requested change of pharmacy at discharge, initial pharmacy closed at time of pt discharge. Prednisone  prescription electronically resent to [facility] on [address]

## 2023-10-26 NOTE — Telephone Encounter (Signed)
 Provider reported to contact pt and notify that Radiologist sees a possible foreign body in the posterior portion of the bottom of foot but this is not certain and also uncertain whether this has anything to do with current symptoms. If not improving, follow up as discussed with Podiatry for re-evaluation. Linear 5 mm density along the plantar aspect of the hindfoot.Finding could represent a retained radiopaque foreign body.Correlate clinically.   Reviewed these results with pt significant other. Verbalized understanding of care plan. Denied any questions or concerns at this time.

## 2023-11-04 ENCOUNTER — Ambulatory Visit
Admission: EM | Admit: 2023-11-04 | Discharge: 2023-11-04 | Disposition: A | Discharge status: Home / Self Care | Payer: MEDICAID | Attending: Family Medicine | Admitting: Family Medicine

## 2023-11-04 DIAGNOSIS — M79671 Pain in right foot: Secondary | ICD-10-CM

## 2023-11-04 MED ORDER — PREDNISONE 20 MG PO TABS: 40.0000 mg | ORAL_TABLET | Freq: Every day | ORAL | 0 refills | Status: AC

## 2023-11-04 NOTE — Discharge Instructions (Signed)
 Follow-up as scheduled with podiatry

## 2023-11-04 NOTE — ED Triage Notes (Signed)
 Pt reports right foot pain and swelling x 2 weeks was seen in clinic on 10/26/2023, has an appointment with podiatrist next week, but states he is unable to walk comfortably on the right foot.

## 2023-11-04 NOTE — ED Provider Notes (Signed)
 [facility]    CSN: [id number] Arrival date & time: 11/04/23  1055      History   Chief Complaint No chief complaint on file.   HPI Grant [name] is a 58 y.o. male.   Patient was seen on 10/26/2023 for right foot pain and swelling to the medial plantar aspect of the foot.  X-ray showed no bony abnormality but a possible foreign body to the plantar aspect of the foot.  Patient was given a course of prednisone  which he states resolved his symptoms until yesterday and now his pain is preventing him from walking comfortably he states he needs relief in order to be able to work this week.  He does not recall any injuries or known foreign bodies in his foot and denies redness, fevers, chills, drainage, lesions.  He did schedule follow-up with podiatry for his ongoing symptoms and has an appointment Monday evening.    Past Medical History:  Diagnosis Date   Kidney stone     Patient Active Problem List   Diagnosis Date Noted   Renal colic on left side 02/02/2013   Hydronephrosis 02/02/2013   Hydronephrosis with ureteral calculus 02/02/2013   ABSCESS, PERIRECTAL 06/14/2010   RECTAL MASS 06/14/2010    Past Surgical History:  Procedure Laterality Date   ABDOMINAL SURGERY     CYSTOSCOPY W/ URETERAL STENT PLACEMENT Left 02/03/2013   Procedure: CYSTOSCOPY WITH RETROGRADE PYELOGRAM/URETERAL STENT PLACEMENT;  Surgeon: Emery LILLETTE Blaze, MD;  Location: [facility];  Service: Urology;  Laterality: Left;   EXTRACORPOREAL SHOCK WAVE LITHOTRIPSY Left 02/06/2013   Procedure: EXTRACORPOREAL SHOCK WAVE LITHOTRIPSY (ESWL) URETERAL CALCULUS;  Surgeon: Emery LILLETTE Blaze, MD;  Location: [facility];  Service: Urology;  Laterality: Left;   KNEE SURGERY         Home Medications    Prior to Admission medications   Medication Sig Start Date End Date Taking? Authorizing Provider  predniSONE  (DELTASONE ) 20 MG tablet Take 2 tablets (40 mg total) by mouth daily with breakfast. 11/04/23  Yes Stuart Vernell Norris, PA-C  HYDROmorphone  (DILAUDID ) 4 MG tablet Take 1 tablet (4 mg total) by mouth every 3 (three) hours as needed (severe pain). 02/06/13   Jadine Toribio SQUIBB, MD  ibuprofen  (ADVIL ) 800 MG tablet Take 1 tablet (800 mg total) by mouth 3 (three) times daily. 12/06/20   Elnor Hila P, DO  methocarbamol  (ROBAXIN ) 500 MG tablet Take 1 tablet (500 mg total) by mouth 2 (two) times daily. 12/06/20   Elnor Hila SQUIBB, DO  predniSONE  (DELTASONE ) 20 MG tablet Take 2 tablets (40 mg total) by mouth daily with breakfast. 10/26/23   Stuart Vernell Norris, PA-C  promethazine -dextromethorphan (PROMETHAZINE -DM) 6.25-15 MG/5ML syrup Take 5 mLs by mouth 4 (four) times daily as needed. 07/03/21   Stuart Vernell Norris, PA-C  tamsulosin  (FLOMAX ) 0.4 MG CAPS capsule Take 0.4 mg by mouth daily.    [provider]    Family History History reviewed. No pertinent family history.  Social History Social History   Tobacco Use   Smoking status: Never  Substance Use Topics   Alcohol use: No   Drug use: Yes    Types: Marijuana    Comment: last used 01/30/13     Allergies   Ivp dye [iodinated contrast media]   Review of Systems Review of Systems Per HPI  Physical Exam Triage Vital Signs ED Triage Vitals  Encounter Vitals Group     BP 11/04/23 1118 112/70     Girls Systolic  BP Percentile --      Girls Diastolic BP Percentile --      Boys Systolic BP Percentile --      Boys Diastolic BP Percentile --      Pulse Rate 11/04/23 1118 75     Resp 11/04/23 1118 20     Temp 11/04/23 1118 98.2 F (36.8 C)     Temp Source 11/04/23 1118 Oral     SpO2 11/04/23 1118 92 %     Weight --      Height --      Head Circumference --      Peak Flow --      Pain Score 11/04/23 1117 8     Pain Loc --      Pain Education --      Exclude from Growth Chart --    No data found.  Updated Vital Signs BP 112/70 (BP Location: Right Arm)   Pulse 75   Temp 98.2 F (36.8 C) (Oral)   Resp 20   SpO2  92%   Visual Acuity Right Eye Distance:   Left Eye Distance:   Bilateral Distance:    Right Eye Near:   Left Eye Near:    Bilateral Near:     Physical Exam Vitals and nursing note reviewed.  Constitutional:      Appearance: Normal appearance.  HENT:     Head: Atraumatic.     Mouth/Throat:     Mouth: Mucous membranes are moist.  Eyes:     Extraocular Movements: Extraocular movements intact.     Conjunctiva/sclera: Conjunctivae normal.  Cardiovascular:     Rate and Rhythm: Normal rate.  Pulmonary:     Effort: Pulmonary effort is normal.  Musculoskeletal:        General: Swelling and tenderness present. Normal range of motion.     Cervical back: Normal range of motion and neck supple.     Comments: Localized area of edema, tenderness to palpation to the medial plantar aspect of the right foot with no appreciable discoloration, fluctuance, induration, skin changes  Skin:    General: Skin is warm and dry.     Findings: No bruising or erythema.  Neurological:     Mental Status: He is oriented to person, place, and time.     Comments: Right lower extremity neurovascularly intact  Psychiatric:        Mood and Affect: Mood normal.        Thought Content: Thought content normal.        Judgment: Judgment normal.      UC Treatments / Results  Labs (all labs ordered are listed, but only abnormal results are displayed) Labs Reviewed - No data to display  EKG   Radiology No results found.  Procedures Procedures (including critical care time)  Medications Ordered in UC Medications - No data to display  Initial Impression / Assessment and Plan / UC Course  I have reviewed the triage vital signs and the nursing notes.  Pertinent labs & imaging results that were available during my care of the patient were reviewed by me and considered in my medical decision making (see chart for details).     Will provide another course of prednisone  while awaiting podiatry  follow-up next week.  Continue Epsom salt soaks, elevation, supportive shoes in the meantime.  X-ray findings are of unclear significance related to his pain, low suspicion for a relation  Final Clinical Impressions(s) / UC Diagnoses   Final diagnoses:  Right foot pain     Discharge Instructions      Follow-up as scheduled with podiatry    ED Prescriptions     Medication Sig Dispense Auth. Provider   predniSONE  (DELTASONE ) 20 MG tablet Take 2 tablets (40 mg total) by mouth daily with breakfast. 10 tablet Stuart Vernell Norris, PA-C      PDMP not reviewed this encounter.   Stuart Vernell Norris, [place] 11/04/23 1204

## 2023-11-06 ENCOUNTER — Encounter: Payer: Self-pay | Admitting: Podiatry

## 2023-11-06 ENCOUNTER — Ambulatory Visit (INDEPENDENT_AMBULATORY_CARE_PROVIDER_SITE_OTHER): Payer: MEDICAID

## 2023-11-06 ENCOUNTER — Ambulatory Visit (INDEPENDENT_AMBULATORY_CARE_PROVIDER_SITE_OTHER): Payer: MEDICAID | Admitting: Podiatry

## 2023-11-06 VITALS — Ht 72.0 in | Wt 215.0 lb

## 2023-11-06 DIAGNOSIS — M7751 Other enthesopathy of right foot: Secondary | ICD-10-CM

## 2023-11-06 DIAGNOSIS — S99922A Unspecified injury of left foot, initial encounter: Secondary | ICD-10-CM

## 2023-11-06 DIAGNOSIS — Q742 Other congenital malformations of lower limb(s), including pelvic girdle: Secondary | ICD-10-CM

## 2023-11-06 DIAGNOSIS — M7750 Other enthesopathy of unspecified foot: Secondary | ICD-10-CM

## 2023-11-06 MED ORDER — MELOXICAM 15 MG PO TABS: 15.0000 mg | ORAL_TABLET | Freq: Every day | ORAL | 0 refills | Status: DC

## 2023-11-06 NOTE — Patient Instructions (Addendum)
 For instructions on how to put on your [facility] Ankle Brace, please visit [email or web address]  -  Start the meloxicam  after finishing the steroids  -  Plantar Fasciitis (Heel Spur Syndrome) with Rehab The plantar fascia is a fibrous, ligament-like, soft-tissue structure that spans the bottom of the foot. Plantar fasciitis is a condition that causes pain in the foot due to inflammation of the tissue. SYMPTOMS  Pain and tenderness on the underneath side of the foot. Pain that worsens with standing or walking. CAUSES  Plantar fasciitis is caused by irritation and injury to the plantar fascia on the underneath side of the foot. Common mechanisms of injury include: Direct trauma to bottom of the foot. Damage to a small nerve that runs under the foot where the main fascia attaches to the heel bone. Stress placed on the plantar fascia due to bone spurs. RISK INCREASES WITH:  Activities that place stress on the plantar fascia (running, jumping, pivoting, or cutting). Poor strength and flexibility. Improperly fitted shoes. Tight calf muscles. Flat feet. Failure to warm-up properly before activity. Obesity. PREVENTION Warm up and stretch properly before activity. Allow for adequate recovery between workouts. Maintain physical fitness: Strength, flexibility, and endurance. Cardiovascular fitness. Maintain a health body weight. Avoid stress on the plantar fascia. Wear properly fitted shoes, including arch supports for individuals who have flat feet.  PROGNOSIS  If treated properly, then the symptoms of plantar fasciitis usually resolve without surgery. However, occasionally surgery is necessary.  RELATED COMPLICATIONS  Recurrent symptoms that may result in a chronic condition. Problems of the lower back that are caused by compensating for the injury, such as limping. Pain or weakness of the foot during push-off following surgery. Chronic inflammation, scarring, and partial or  complete fascia tear, occurring more often from repeated injections.  TREATMENT  Treatment initially involves the use of ice and medication to help reduce pain and inflammation. The use of strengthening and stretching exercises may help reduce pain with activity, especially stretches of the Achilles tendon. These exercises may be performed at home or with a therapist. Your caregiver may recommend that you use heel cups of arch supports to help reduce stress on the plantar fascia. Occasionally, corticosteroid injections are given to reduce inflammation. If symptoms persist for greater than 6 months despite non-surgical (conservative), then surgery may be recommended.   MEDICATION  If pain medication is necessary, then nonsteroidal anti-inflammatory medications, such as aspirin and ibuprofen , or other minor pain relievers, such as acetaminophen , are often recommended. Do not take pain medication within 7 days before surgery. Prescription pain relievers may be given if deemed necessary by your caregiver. Use only as directed and only as much as you need. Corticosteroid injections may be given by your caregiver. These injections should be reserved for the most serious cases, because they may only be given a certain number of times.  HEAT AND COLD Cold treatment (icing) relieves pain and reduces inflammation. Cold treatment should be applied for 10 to 15 minutes every 2 to 3 hours for inflammation and pain and immediately after any activity that aggravates your symptoms. Use ice packs or massage the area with a piece of ice (ice massage). Heat treatment may be used prior to performing the stretching and strengthening activities prescribed by your caregiver, physical therapist, or athletic trainer. Use a heat pack or soak the injury in warm water.  SEEK IMMEDIATE MEDICAL CARE IF: Treatment seems to offer no benefit, or the condition worsens. Any medications produce adverse side  effects.  EXERCISES- RANGE OF MOTION (ROM) AND STRETCHING EXERCISES - Plantar Fasciitis (Heel Spur Syndrome) These exercises may help you when beginning to rehabilitate your injury. Your symptoms may resolve with or without further involvement from your physician, physical therapist or athletic trainer. While completing these exercises, remember:  Restoring tissue flexibility helps normal motion to return to the joints. This allows healthier, less painful movement and activity. An effective stretch should be held for at least 30 seconds. A stretch should never be painful. You should only feel a gentle lengthening or release in the stretched tissue.  RANGE OF MOTION - Toe Extension, Flexion Sit with your right / left leg crossed over your opposite knee. Grasp your toes and gently pull them back toward the top of your foot. You should feel a stretch on the bottom of your toes and/or foot. Hold this stretch for 10 seconds. Now, gently pull your toes toward the bottom of your foot. You should feel a stretch on the top of your toes and or foot. Hold this stretch for 10 seconds. Repeat  times. Complete this stretch 3 times per day.   RANGE OF MOTION - Ankle Dorsiflexion, Active Assisted Remove shoes and sit on a chair that is preferably not on a carpeted surface. Place right / left foot under knee. Extend your opposite leg for support. Keeping your heel down, slide your right / left foot back toward the chair until you feel a stretch at your ankle or calf. If you do not feel a stretch, slide your bottom forward to the edge of the chair, while still keeping your heel down. Hold this stretch for 10 seconds. Repeat 3 times. Complete this stretch 2 times per day.   STRETCH  Gastroc, Standing Place hands on wall. Extend right / left leg, keeping the front knee somewhat bent. Slightly point your toes inward on your back foot. Keeping your right / left heel on the floor and your knee straight, shift  your weight toward the wall, not allowing your back to arch. You should feel a gentle stretch in the right / left calf. Hold this position for 10 seconds. Repeat 3 times. Complete this stretch 2 times per day.  STRETCH  Soleus, Standing Place hands on wall. Extend right / left leg, keeping the other knee somewhat bent. Slightly point your toes inward on your back foot. Keep your right / left heel on the floor, bend your back knee, and slightly shift your weight over the back leg so that you feel a gentle stretch deep in your back calf. Hold this position for 10 seconds. Repeat 3 times. Complete this stretch 2 times per day.  STRETCH  Gastrocsoleus, Standing  Note: This exercise can place a lot of stress on your foot and ankle. Please complete this exercise only if specifically instructed by your caregiver.  Place the ball of your right / left foot on a step, keeping your other foot firmly on the same step. Hold on to the wall or a rail for balance. Slowly lift your other foot, allowing your body weight to press your heel down over the edge of the step. You should feel a stretch in your right / left calf. Hold this position for 10 seconds. Repeat this exercise with a slight bend in your right / left knee. Repeat 3 times. Complete this stretch 2 times per day.   STRENGTHENING EXERCISES - Plantar Fasciitis (Heel Spur Syndrome)  These exercises may help you when beginning to rehabilitate your injury.  They may resolve your symptoms with or without further involvement from your physician, physical therapist or athletic trainer. While completing these exercises, remember:  Muscles can gain both the endurance and the strength needed for everyday activities through controlled exercises. Complete these exercises as instructed by your physician, physical therapist or athletic trainer. Progress the resistance and repetitions only as guided.  STRENGTH - Towel Curls Sit in a chair positioned on a  non-carpeted surface. Place your foot on a towel, keeping your heel on the floor. Pull the towel toward your heel by only curling your toes. Keep your heel on the floor. Repeat 3 times. Complete this exercise 2 times per day.  STRENGTH - Ankle Inversion Secure one end of a rubber exercise band/tubing to a fixed object (table, pole). Loop the other end around your foot just before your toes. Place your fists between your knees. This will focus your strengthening at your ankle. Slowly, pull your big toe up and in, making sure the band/tubing is positioned to resist the entire motion. Hold this position for 10 seconds. Have your muscles resist the band/tubing as it slowly pulls your foot back to the starting position. Repeat 3 times. Complete this exercises 2 times per day.  Document Released: 04/11/2005 Document Revised: 07/04/2011 Document Reviewed: 07/24/2008 [facility] Patient Information 2014 [place], [place].

## 2023-11-09 NOTE — Progress Notes (Signed)
 Subjective:   Patient ID: Grant [name], male   DOB: 58 y.o.   MRN: [id number]   HPI Chief Complaint  Patient presents with   Foot Pain    Patient is here for right foot pain urgent  care told him there was a foreign object in foot patient states no injury   58 year old male presents the office with concerns of right foot pain.  He states he has any pain he points along the arch as well as the medial aspect foot over the navicular tuberosity.  He was seen in urgent care and x-rays were taken they noticed a foreign body he was referred to us .  He does not report any injury.  This did start after he started wearing [employer].    Review of Systems  All other systems reviewed and are negative.  Past Medical History:  Diagnosis Date   Kidney stone     Past Surgical History:  Procedure Laterality Date   ABDOMINAL SURGERY     CYSTOSCOPY W/ URETERAL STENT PLACEMENT Left 02/03/2013   Procedure: CYSTOSCOPY WITH RETROGRADE PYELOGRAM/URETERAL STENT PLACEMENT;  Surgeon: Mohammad I Javaid, MD;  Location: [facility];  Service: Urology;  Laterality: Left;   EXTRACORPOREAL SHOCK WAVE LITHOTRIPSY Left 02/06/2013   Procedure: EXTRACORPOREAL SHOCK WAVE LITHOTRIPSY (ESWL) URETERAL CALCULUS;  Surgeon: Emery LILLETTE Blaze, MD;  Location: [facility];  Service: Urology;  Laterality: Left;   KNEE SURGERY       Current Outpatient Medications:    HYDROmorphone  (DILAUDID ) 4 MG tablet, Take 1 tablet (4 mg total) by mouth every 3 (three) hours as needed (severe pain)., Disp: , Rfl:    ibuprofen  (ADVIL ) 800 MG tablet, Take 1 tablet (800 mg total) by mouth 3 (three) times daily., Disp: 21 tablet, Rfl: 0   meloxicam  (MOBIC ) 15 MG tablet, Take 1 tablet (15 mg total) by mouth daily., Disp: 30 tablet, Rfl: 0   methocarbamol  (ROBAXIN ) 500 MG tablet, Take 1 tablet (500 mg total) by mouth 2 (two) times daily., Disp: 20 tablet, Rfl: 0   predniSONE  (DELTASONE ) 20 MG tablet, Take 2 tablets (40 mg total) by mouth daily with  breakfast., Disp: 10 tablet, Rfl: 0   predniSONE  (DELTASONE ) 20 MG tablet, Take 2 tablets (40 mg total) by mouth daily with breakfast., Disp: 10 tablet, Rfl: 0   promethazine -dextromethorphan (PROMETHAZINE -DM) 6.25-15 MG/5ML syrup, Take 5 mLs by mouth 4 (four) times daily as needed., Disp: 100 mL, Rfl: 0   tamsulosin  (FLOMAX ) 0.4 MG CAPS capsule, Take 0.4 mg by mouth daily., Disp: , Rfl:   Allergies  Allergen Reactions   Ivp Dye [Iodinated Contrast Media]           Objective:  Physical Exam  General: AAO x3, NAD  Dermatological: There is no puncture wound identified today and there is no edema, erythema or any signs of infection.  Vascular: Dorsalis Pedis artery and Posterior Tibial artery pedal pulses are 2/4 bilateral with immedate capillary fill time.  There is no pain with calf compression, swelling, warmth, erythema.   Neruologic: Grossly intact via light touch bilateral.   Musculoskeletal: Majority of tenderness is localized on the navicular tuberosity as well as the medial arch of the foot.  There is no specific pinpoint tenderness.  On the area the foreign body to the heel there is no pain associated with this.  Flexor, extensor tendons appear to be intact.  MMT 5/5.       Assessment:   Accessory navicular, arch pain  Plan:  -Treatment options discussed including all alternatives, risks, and complications -Etiology of symptoms were discussed -X-rays were obtained and reviewed with the patient.  Accessory navicular is present.  Is no evidence of acute fracture.  Foreign body noted prior x-ray -I do not think a foreign object is anything to do with his pain.  He does not recall any recent or stepping any foreign objects but there is no signs of infection or any puncture wound.  This is incidental finding.  Symptoms are more consistent with the accessory navicular, arch pain. -He is still taking the steroids.  He has a couple more days of this and once completed he can  start meloxicam  but advised not to take together. -We discussed stretching, icing on regular basis -Discussed wearing shoes with better support. -If symptoms persist recommend MRI or advanced imaging.  Donnice JONELLE Fees DPM

## 2023-12-05 ENCOUNTER — Other Ambulatory Visit: Payer: Self-pay | Admitting: Podiatry

## 2024-01-04 ENCOUNTER — Other Ambulatory Visit: Payer: Self-pay | Admitting: Podiatry
